# Patient Record
Sex: Male | Born: 1935 | Race: Black or African American | Hispanic: No | Marital: Single | Smoking: Never smoker
Health system: Southern US, Community
[De-identification: ages and names within clinical notes are randomized; demographics above are authoritative.]

## PROBLEM LIST (undated history)

## (undated) DIAGNOSIS — C801 Malignant (primary) neoplasm, unspecified: Secondary | ICD-10-CM

## (undated) DIAGNOSIS — I10 Essential (primary) hypertension: Secondary | ICD-10-CM

## (undated) DIAGNOSIS — I639 Cerebral infarction, unspecified: Secondary | ICD-10-CM

## (undated) HISTORY — PX: NO PAST SURGERIES: SHX2092

---

## 2003-12-28 ENCOUNTER — Ambulatory Visit: Payer: Self-pay | Admitting: Radiation Oncology

## 2004-01-28 ENCOUNTER — Ambulatory Visit: Payer: Self-pay | Admitting: Radiation Oncology

## 2004-02-27 ENCOUNTER — Ambulatory Visit: Payer: Self-pay | Admitting: Radiation Oncology

## 2004-03-29 ENCOUNTER — Ambulatory Visit: Payer: Self-pay | Admitting: Radiation Oncology

## 2009-04-14 ENCOUNTER — Emergency Department: Payer: Self-pay | Admitting: Emergency Medicine

## 2009-06-07 ENCOUNTER — Emergency Department: Payer: Self-pay | Admitting: Unknown Physician Specialty

## 2009-06-16 ENCOUNTER — Emergency Department: Payer: Self-pay | Admitting: Emergency Medicine

## 2014-11-27 ENCOUNTER — Encounter: Payer: Self-pay | Admitting: *Deleted

## 2014-11-27 ENCOUNTER — Emergency Department: Payer: Medicare Other

## 2014-11-27 ENCOUNTER — Inpatient Hospital Stay
Admission: EM | Admit: 2014-11-27 | Discharge: 2014-12-01 | DRG: 064 | Disposition: A | Discharge status: Skilled Nursing Facility | Payer: Medicare Other | Attending: Internal Medicine | Admitting: Internal Medicine

## 2014-11-27 DIAGNOSIS — I4891 Unspecified atrial fibrillation: Secondary | ICD-10-CM | POA: Diagnosis present

## 2014-11-27 DIAGNOSIS — I10 Essential (primary) hypertension: Secondary | ICD-10-CM | POA: Diagnosis present

## 2014-11-27 DIAGNOSIS — Y92009 Unspecified place in unspecified non-institutional (private) residence as the place of occurrence of the external cause: Secondary | ICD-10-CM

## 2014-11-27 DIAGNOSIS — N179 Acute kidney failure, unspecified: Secondary | ICD-10-CM | POA: Diagnosis present

## 2014-11-27 DIAGNOSIS — R7989 Other specified abnormal findings of blood chemistry: Secondary | ICD-10-CM

## 2014-11-27 DIAGNOSIS — R29810 Facial weakness: Secondary | ICD-10-CM | POA: Diagnosis present

## 2014-11-27 DIAGNOSIS — D696 Thrombocytopenia, unspecified: Secondary | ICD-10-CM | POA: Diagnosis present

## 2014-11-27 DIAGNOSIS — Z859 Personal history of malignant neoplasm, unspecified: Secondary | ICD-10-CM

## 2014-11-27 DIAGNOSIS — Z923 Personal history of irradiation: Secondary | ICD-10-CM | POA: Diagnosis not present

## 2014-11-27 DIAGNOSIS — I672 Cerebral atherosclerosis: Secondary | ICD-10-CM | POA: Diagnosis present

## 2014-11-27 DIAGNOSIS — T796XXA Traumatic ischemia of muscle, initial encounter: Secondary | ICD-10-CM

## 2014-11-27 DIAGNOSIS — R778 Other specified abnormalities of plasma proteins: Secondary | ICD-10-CM | POA: Diagnosis present

## 2014-11-27 DIAGNOSIS — I63511 Cerebral infarction due to unspecified occlusion or stenosis of right middle cerebral artery: Secondary | ICD-10-CM | POA: Diagnosis not present

## 2014-11-27 DIAGNOSIS — D72829 Elevated white blood cell count, unspecified: Secondary | ICD-10-CM | POA: Diagnosis present

## 2014-11-27 DIAGNOSIS — Z79899 Other long term (current) drug therapy: Secondary | ICD-10-CM | POA: Diagnosis not present

## 2014-11-27 DIAGNOSIS — Z515 Encounter for palliative care: Secondary | ICD-10-CM

## 2014-11-27 DIAGNOSIS — Z8673 Personal history of transient ischemic attack (TIA), and cerebral infarction without residual deficits: Secondary | ICD-10-CM | POA: Insufficient documentation

## 2014-11-27 DIAGNOSIS — I214 Non-ST elevation (NSTEMI) myocardial infarction: Secondary | ICD-10-CM

## 2014-11-27 DIAGNOSIS — W19XXXA Unspecified fall, initial encounter: Secondary | ICD-10-CM | POA: Diagnosis present

## 2014-11-27 DIAGNOSIS — Z7982 Long term (current) use of aspirin: Secondary | ICD-10-CM

## 2014-11-27 DIAGNOSIS — I639 Cerebral infarction, unspecified: Secondary | ICD-10-CM

## 2014-11-27 DIAGNOSIS — G936 Cerebral edema: Secondary | ICD-10-CM | POA: Diagnosis present

## 2014-11-27 DIAGNOSIS — I493 Ventricular premature depolarization: Secondary | ICD-10-CM | POA: Diagnosis present

## 2014-11-27 DIAGNOSIS — M6282 Rhabdomyolysis: Secondary | ICD-10-CM | POA: Diagnosis present

## 2014-11-27 DIAGNOSIS — G8194 Hemiplegia, unspecified affecting left nondominant side: Secondary | ICD-10-CM | POA: Diagnosis present

## 2014-11-27 DIAGNOSIS — R531 Weakness: Secondary | ICD-10-CM

## 2014-11-27 HISTORY — DX: Cerebral infarction, unspecified: I63.9

## 2014-11-27 HISTORY — DX: Malignant (primary) neoplasm, unspecified: C80.1

## 2014-11-27 HISTORY — DX: Essential (primary) hypertension: I10

## 2014-11-27 LAB — DIFFERENTIAL
Basophils Absolute: 0.2 10*3/uL — ABNORMAL HIGH (ref 0–0.1)
Basophils Relative: 1 %
EOS ABS: 0 10*3/uL (ref 0–0.7)
EOS PCT: 0 %
LYMPHS ABS: 1.1 10*3/uL (ref 1.0–3.6)
LYMPHS PCT: 7 %
MONOS PCT: 9 %
Monocytes Absolute: 1.4 10*3/uL — ABNORMAL HIGH (ref 0.2–1.0)
NEUTROS PCT: 83 %
Neutro Abs: 13.1 10*3/uL — ABNORMAL HIGH (ref 1.4–6.5)

## 2014-11-27 LAB — BASIC METABOLIC PANEL
ANION GAP: 10 (ref 5–15)
BUN: 31 mg/dL — ABNORMAL HIGH (ref 6–20)
CHLORIDE: 105 mmol/L (ref 101–111)
CO2: 24 mmol/L (ref 22–32)
Calcium: 8.8 mg/dL — ABNORMAL LOW (ref 8.9–10.3)
Creatinine, Ser: 1.73 mg/dL — ABNORMAL HIGH (ref 0.61–1.24)
GFR calc Af Amer: 41 mL/min — ABNORMAL LOW (ref >60)
GFR, EST NON AFRICAN AMERICAN: 35 mL/min — AB (ref >60)
GLUCOSE: 105 mg/dL — AB (ref 65–99)
POTASSIUM: 4.2 mmol/L (ref 3.5–5.1)
SODIUM: 139 mmol/L (ref 135–145)

## 2014-11-27 LAB — CBC
HEMATOCRIT: 46.5 % (ref 40.0–52.0)
HEMOGLOBIN: 15.5 g/dL (ref 13.0–18.0)
MCH: 30.7 pg (ref 26.0–34.0)
MCHC: 33.4 g/dL (ref 32.0–36.0)
MCV: 92.1 fL (ref 80.0–100.0)
Platelets: 134 10*3/uL — ABNORMAL LOW (ref 150–440)
RBC: 5.05 MIL/uL (ref 4.40–5.90)
RDW: 13.8 % (ref 11.5–14.5)
WBC: 15.8 10*3/uL — ABNORMAL HIGH (ref 3.8–10.6)

## 2014-11-27 LAB — CK
Total CK: 1816 U/L — ABNORMAL HIGH (ref 49–397)
Total CK: 2263 U/L — ABNORMAL HIGH (ref 49–397)
Total CK: 2759 U/L — ABNORMAL HIGH (ref 49–397)

## 2014-11-27 LAB — COMPREHENSIVE METABOLIC PANEL
ALBUMIN: 4 g/dL (ref 3.5–5.0)
ALK PHOS: 73 U/L (ref 38–126)
ALT: 23 U/L (ref 17–63)
ANION GAP: 14 (ref 5–15)
AST: 70 U/L — ABNORMAL HIGH (ref 15–41)
BILIRUBIN TOTAL: 1.2 mg/dL (ref 0.3–1.2)
BUN: 30 mg/dL — AB (ref 6–20)
CALCIUM: 9.5 mg/dL (ref 8.9–10.3)
CO2: 22 mmol/L (ref 22–32)
Chloride: 104 mmol/L (ref 101–111)
Creatinine, Ser: 1.79 mg/dL — ABNORMAL HIGH (ref 0.61–1.24)
GFR calc Af Amer: 39 mL/min — ABNORMAL LOW (ref >60)
GFR, EST NON AFRICAN AMERICAN: 34 mL/min — AB (ref >60)
GLUCOSE: 121 mg/dL — AB (ref 65–99)
POTASSIUM: 4.2 mmol/L (ref 3.5–5.1)
Sodium: 140 mmol/L (ref 135–145)
TOTAL PROTEIN: 7.5 g/dL (ref 6.5–8.1)

## 2014-11-27 LAB — TROPONIN I
TROPONIN I: 0.09 ng/mL — AB (ref <0.031)
TROPONIN I: 0.15 ng/mL — AB (ref <0.031)
Troponin I: 0.12 ng/mL — ABNORMAL HIGH (ref <0.031)

## 2014-11-27 LAB — PROTIME-INR
INR: 1.15
Prothrombin Time: 14.9 seconds (ref 11.4–15.0)

## 2014-11-27 LAB — APTT: aPTT: 30 seconds (ref 24–36)

## 2014-11-27 MED ORDER — ACETAMINOPHEN 650 MG RE SUPP: 650.0000 mg | Freq: Four times a day (QID) | RECTAL | Status: DC | PRN

## 2014-11-27 MED ORDER — ASPIRIN 81 MG PO CHEW
324.0000 mg | CHEWABLE_TABLET | Freq: Once | ORAL | Status: AC
  Administered 2014-11-27: 324 mg via ORAL
  Filled 2014-11-27: qty 4

## 2014-11-27 MED ORDER — NITROGLYCERIN 2 % TD OINT
0.5000 [in_us] | TOPICAL_OINTMENT | Freq: Once | TRANSDERMAL | Status: AC
  Administered 2014-11-27: 0.5 [in_us] via TOPICAL
  Filled 2014-11-27: qty 1

## 2014-11-27 MED ORDER — SODIUM CHLORIDE 0.9 % IV SOLN
Freq: Once | INTRAVENOUS | Status: AC
  Administered 2014-11-27: 17:00:00 via INTRAVENOUS

## 2014-11-27 MED ORDER — HEPARIN BOLUS VIA INFUSION
4000.0000 [IU] | Freq: Once | INTRAVENOUS | Status: AC
  Administered 2014-11-27: 4000 [IU] via INTRAVENOUS
  Filled 2014-11-27: qty 4000

## 2014-11-27 MED ORDER — SODIUM CHLORIDE 0.9 % IV BOLUS (SEPSIS)
500.0000 mL | INTRAVENOUS | Status: AC
  Administered 2014-11-27: 500 mL via INTRAVENOUS

## 2014-11-27 MED ORDER — HEPARIN SODIUM (PORCINE) 5000 UNIT/ML IJ SOLN: 60.0000 [IU]/kg | Freq: Once | INTRAMUSCULAR | Status: DC

## 2014-11-27 MED ORDER — SODIUM CHLORIDE 0.9 % IV SOLN: Freq: Once | INTRAVENOUS | Status: DC

## 2014-11-27 MED ORDER — STROKE: EARLY STAGES OF RECOVERY BOOK
Freq: Once | Status: AC
  Administered 2014-11-27

## 2014-11-27 MED ORDER — ACETAMINOPHEN 325 MG PO TABS: 650.0000 mg | ORAL_TABLET | Freq: Four times a day (QID) | ORAL | Status: DC | PRN

## 2014-11-27 MED ORDER — HEPARIN (PORCINE) IN NACL 100-0.45 UNIT/ML-% IJ SOLN: 10.0000 [IU]/kg/h | Freq: Once | INTRAMUSCULAR | Status: DC

## 2014-11-27 MED ORDER — SODIUM CHLORIDE 0.9 % IJ SOLN
3.0000 mL | Freq: Two times a day (BID) | INTRAMUSCULAR | Status: DC
  Administered 2014-11-27 – 2014-11-30 (×4): 3 mL via INTRAVENOUS

## 2014-11-27 MED ORDER — SODIUM CHLORIDE 0.9 % IV SOLN
INTRAVENOUS | Status: AC
  Administered 2014-11-27: 23:00:00 via INTRAVENOUS

## 2014-11-27 MED ORDER — ONDANSETRON HCL 4 MG/2ML IJ SOLN
4.0000 mg | Freq: Four times a day (QID) | INTRAMUSCULAR | Status: DC | PRN
Start: 2014-11-27 — End: 2014-12-01
  Filled 2014-11-27: qty 2

## 2014-11-27 MED ORDER — ONDANSETRON HCL 4 MG PO TABS: 4.0000 mg | ORAL_TABLET | Freq: Four times a day (QID) | ORAL | Status: DC | PRN

## 2014-11-27 MED ORDER — HEPARIN (PORCINE) IN NACL 100-0.45 UNIT/ML-% IJ SOLN
850.0000 [IU]/h | INTRAMUSCULAR | Status: DC
  Administered 2014-11-27: 850 [IU]/h via INTRAVENOUS
  Filled 2014-11-27: qty 250

## 2014-11-27 NOTE — ED Notes (Signed)
pts family arrived at bedside, states they are unsure of the exact time they saw pt at baseline, state he has been weak and off for a while

## 2014-11-27 NOTE — ED Notes (Signed)
Patient transported to CT 

## 2014-11-27 NOTE — ED Provider Notes (Addendum)
-----------------------------------------   3:57 PM on 11/27/2014 -----------------------------------------   Blood pressure 151/83, pulse 72, temperature 97.6 F (36.4 C), temperature source Oral, resp. rate 17, height $RemoveBe'5\' 8"'UjcZBDJwj$  (1.727 m), weight 155 lb 10.3 oz (70.6 kg), SpO2 99 %.  Assuming care from Dr. Cinda Quest.  In short, Zachary Leonard is a 81 y.o. male with a chief complaint of Fall .  Refer to the original H&P for additional details.  The current plan of care is to follow-up on the transfer request to the [place].  The patient has an apparent subacute CVA based on his left-sided facial droop and left arm weakness that is outside the window for TPA because we do not know the onset of symptoms.  He also has a rising troponin and a CK consistent with rhabdomyolysis given the amount of time he was down on the floor.  I have ordered a 500 mL normal saline IV bolus followed by normal saline at 250 mL an hour.  I have added on a CK to his second troponin to see if the CK continues to rise.  The patient is currently receiving heparin as per Dr. Sonny Dandy orders.  ----------------------------------------- 5:06 PM on 11/27/2014 -----------------------------------------  Dr. Cinda Quest and I met with the patient's family, and Dr. Cinda Quest reevaluated the patient and spoke with him.  Patient no longer wants to go to the [place] and he is signing the paper waiting that right.  Additionally, it is notable that his CK and his troponin continued to rise and it is likely more appropriate that he stay here rather than be transported under the circumstances.  He is currently getting a heparin drip, IV fluids for the rhabdomyolysis, and will need intensive care at least for the short-term.  I spoken with the hospitalist who will admit.  ----------------------------------------- 7:38 PM on 11/27/2014 -----------------------------------------  After my last note, the patient told Dr. Cinda Quest that actually he did not refuse  admission to the [place], and that he would be willing to go if they have a bed.  However, in the intervening hours, we have continued to call the [facility] at least once an hour, and they have not yet determined the disposition.  I just reassessed the patient and he is extremely somnolent with profound neurological deficits.  The last time he checked he had a rising troponin and a rising CK.  I believe that he is medically unstable to continue waiting in the emergency department for disposition to the [place] or for transport.  I will admit here to this hospital for the patient's safety.  ----------------------------------------- 8:22 PM on 11/27/2014 -----------------------------------------  I spoke by phone with Dr. Kaylyn Layer at the [facility].  he agrees that the patient is too ill for transport at this time, and he also states that they do not have the capacity to accept him as a transfer.  We are proceeding with the plan for admission to [facility].  Hinda Kehr, MD 11/27/14 2023

## 2014-11-27 NOTE — Progress Notes (Signed)
ANTICOAGULATION CONSULT NOTE - Initial Consult  Pharmacy Consult for Heparin Drip Indication: chest pain/ACS  No Known Allergies  Patient Measurements: Height: 5\' 8"  (172.7 cm) Weight: 155 lb 10.3 oz (70.6 kg) IBW/kg (Calculated) : 68.4 Heparin Dosing Weight: 70.6  Vital Signs: Temp: 97.6 F (36.4 C) (08/31 1209) Temp Source: Oral (08/31 1209) BP: 151/83 mmHg (08/31 1500) Pulse Rate: 72 (08/31 1500)  Labs:  Recent Labs  11/27/14 1207 11/27/14 1426  HGB 15.5  --   HCT 46.5  --   PLT 134*  --   APTT 30  --   LABPROT 14.9  --   INR 1.15  --   CREATININE 1.79*  --   CKTOTAL 1816*  --   TROPONINI 0.09* 0.12*    Estimated Creatinine Clearance: 31.3 mL/min (by C-G formula based on Cr of 1.79).   Medical History: Past Medical History  Diagnosis Date  . Cancer     Medications:  Scheduled:  . heparin  4,000 Units Intravenous Once   Infusions:  . heparin      Assessment: 81 yo male to start Heparin Drip for ACS/STEMI.  Goal of Therapy:  Heparin level 0.3-0.7 units/ml Monitor platelets by anticoagulation protocol: Yes   Plan:  Give 4000 units bolus x 1,  then begin Heparin drip at 850 units/hr.  Will check Heparin level in 8 hours on 11/28/14 at [place]. CBC in am  Chinita Greenland PharmD Clinical Pharmacist 11/27/2014  3:29 PM

## 2014-11-27 NOTE — ED Notes (Signed)
Dr. Salley Scarlet notified of critical trop of 0.12

## 2014-11-27 NOTE — ED Notes (Signed)
Dr. Salley Scarlet notified of critical trop of 0.09

## 2014-11-27 NOTE — H&P (Signed)
[facility] [place] NAME: Zachary Leonard    MR#:  [id number]  DATE OF BIRTH:  Sep 16, 1931  DATE OF ADMISSION:  11/27/2014  PRIMARY CARE PHYSICIAN: No primary care provider on file.   REQUESTING/REFERRING PHYSICIAN: Karma Greaser, MD  CHIEF COMPLAINT:   Chief Complaint  Patient presents with  . Fall    HISTORY OF PRESENT ILLNESS:  Zachary Leonard  is a 81 y.o. male who presents with left-sided weakness and left facial droop. Patient was found down at home for an unknown period of time by a maintenance worker for early this morning. He states that he fell last night when his "legs gave way". He denies any symptoms of chest pain, shortness of breath, headache, visual changes, diaphoresis, abdominal pain. However, he states that once he fallen he was unable to use the left side of his body, and was therefore unable to get himself up. He states that initially his right arm and leg felt weak as well. On evaluation in the ED patient has a mildly positive troponin, an elevated CPK, persistent left hemiparesis. CT head is suggestive of cerebral infarct. Hospitalists were called for admission for acute stroke.  PAST MEDICAL HISTORY:   Past Medical History  Diagnosis Date  . Cancer     prostate, treated with XRT  . HTN (hypertension)     PAST SURGICAL HISTORY:   Past Surgical History  Procedure Laterality Date  . No past surgeries      SOCIAL HISTORY:   Social History  Substance Use Topics  . Smoking status: Never Smoker   . Smokeless tobacco: Not on file  . Alcohol Use: No     Comment: quit 10-12 years ago, prior drank 1 glass of wine daily    FAMILY HISTORY:   Family History  Problem Relation Age of Onset  . Hypertension      DRUG ALLERGIES:  No Known Allergies  MEDICATIONS AT HOME:   Prior to Admission medications   Medication Sig Start Date End Date Taking? Authorizing Provider  aspirin EC 81 MG tablet Take 81 mg by mouth  daily.   Yes Historical Provider, MD  lisinopril-hydrochlorothiazide (PRINZIDE,ZESTORETIC) 10-12.5 MG per tablet Take 1 tablet by mouth daily.   Yes Historical Provider, MD  Multiple Vitamin (MULTIVITAMIN WITH MINERALS) TABS tablet Take 1 tablet by mouth daily.   Yes Historical Provider, MD    REVIEW OF SYSTEMS:  Review of Systems  Constitutional: Negative for fever, chills, weight loss and malaise/fatigue.  HENT: Negative for ear pain, hearing loss and tinnitus.   Eyes: Negative for blurred vision, double vision, pain and redness.  Respiratory: Negative for cough, hemoptysis and shortness of breath.   Cardiovascular: Negative for chest pain, palpitations, orthopnea and leg swelling.  Gastrointestinal: Negative for nausea, vomiting, abdominal pain, diarrhea and constipation.  Genitourinary: Negative for dysuria, frequency and hematuria.  Musculoskeletal: Negative for back pain, joint pain and neck pain.  Skin:       No acne, rash, or lesions  Neurological: Positive for focal weakness (left hemiparesis, left facial droop) and weakness. Negative for dizziness, tremors, sensory change and loss of consciousness.  Endo/Heme/Allergies: Negative for polydipsia. Does not bruise/bleed easily.  Psychiatric/Behavioral: Negative for depression. The patient is not nervous/anxious and does not have insomnia.      VITAL SIGNS:   Filed Vitals:   11/27/14 1830 11/27/14 1900 11/27/14 1930 11/27/14 2000  BP: 157/80 134/78 149/66 165/92  Pulse: 71 65 66 73  Temp:      TempSrc:      Resp: 24 20 20 18   Height:      Weight:      SpO2: 99% 95% 98% 98%   Wt Readings from Last 3 Encounters:  11/27/14 70.6 kg (155 lb 10.3 oz)    PHYSICAL EXAMINATION:  Physical Exam  Vitals reviewed. Constitutional: He is oriented to person, place, and time. He appears well-developed and well-nourished. No distress.  HENT:  Head: Normocephalic and atraumatic.  Mouth/Throat: Oropharynx is clear and moist.  Eyes:  Conjunctivae and EOM are normal. Pupils are equal, round, and reactive to light. No scleral icterus.  Neck: Normal range of motion. Neck supple. No JVD present. No thyromegaly present.  Cardiovascular: Normal rate, regular rhythm and intact distal pulses.  Exam reveals no gallop and no friction rub.   No murmur heard. Respiratory: Effort normal and breath sounds normal. No respiratory distress. He has no wheezes. He has no rales.  GI: Soft. Bowel sounds are normal. He exhibits no distension. There is no tenderness.  Musculoskeletal: Normal range of motion. He exhibits no edema.  No arthritis, no gout  Lymphadenopathy:    He has no cervical adenopathy.  Neurological: He is alert and oriented to person, place, and time.  Neurologic: Cranial nerves II-XII - significant left lower facial droop with mild weakness noted left upper face and leftward tongue deviation, Sensation intact to light touch/pinprick, 5/5 strength in right extremities,  left upper extremity has 3/5 strength, left lower extremity has 2/5 strength,  positive for dysarthria, no aphasia, memory intact, difficult to test, but suspect left visual field cut versus left neglect.   Skin: Skin is warm and dry. No rash noted. No erythema.  Psychiatric: He has a normal mood and affect. His behavior is normal. Judgment and thought content normal.    LABORATORY PANEL:   CBC  Recent Labs Lab 11/27/14 1207  WBC 15.8*  HGB 15.5  HCT 46.5  PLT 134*   ------------------------------------------------------------------------------------------------------------------  Chemistries   Recent Labs Lab 11/27/14 1207 11/27/14 1950  NA 140 139  K 4.2 4.2  CL 104 105  CO2 22 24  GLUCOSE 121* 105*  BUN 30* 31*  CREATININE 1.79* 1.73*  CALCIUM 9.5 8.8*  AST 70*  --   ALT 23  --   ALKPHOS 73  --   BILITOT 1.2  --     ------------------------------------------------------------------------------------------------------------------  Cardiac Enzymes  Recent Labs Lab 11/27/14 1950  TROPONINI 0.15*   ------------------------------------------------------------------------------------------------------------------  RADIOLOGY:  Ct Head Wo Contrast  11/27/2014   CLINICAL DATA:  Found unconscious at home.  Left-sided weakness.  EXAM: CT HEAD WITHOUT CONTRAST  TECHNIQUE: Contiguous axial images were obtained from the base of the skull through the vertex without intravenous contrast.  COMPARISON:  None.  FINDINGS: Stable age related cerebral atrophy, ventriculomegaly and periventricular white matter disease. No extra-axial fluid collections are identified. No CT findings for acute hemispheric infarction or intracranial hemorrhage. No mass lesions. There is and age indeterminate right thalamic lacunar type infarct. The brainstem and cerebellum are normal.  No acute bony findings. The paranasal sinuses and mastoid air cells are clear.  IMPRESSION: 1. Age related cerebral atrophy, ventriculomegaly and periventricular white matter disease. 2. No findings for hemispheric infarction or intracranial hemorrhage. 3. Age-indeterminate right thymic lacunar type infarct. MRI with diffusion weighted imaging may be helpful for further evaluation.   Electronically Signed   By: Marijo Sanes M.D.   On: 11/27/2014 12:50  EKG:   Orders placed or performed during the hospital encounter of 11/27/14  . ED EKG  . ED EKG    IMPRESSION AND PLAN:  Principal Problem:   CVA (cerebral infarction) - based on patient's clinical symptoms and suggested on CT head. Patient is currently on heparin drip due to his elevated troponin and some concern for potential cardiac problem. Allow permissive hypertension to blood pressure less than 220/110 for the first 24 hours, check fasting lipid panel, hemoglobin A1c, continue to trend troponins, get  an echocardiogram, MRI/MRA head and neck, consult PT, OT, speech therapy, other standard orders per stroke order set, and get a neurology consult Active Problems:   Benign essential HTN - hold antihypertensives with goal blood pressure as above for the first 24 hours   Elevated troponin - potentially due to his stroke, potentially due to his rhabdo, we'll continue to trend, currently on heparin drip, get cardiology consult   Rhabdomyolysis - due to being down for an unknown amount of time. Trend CPK, moderately aggressive fluids, and avoid starting any statins for now.   AKI (acute kidney injury) -  unknown baseline, the presumed to be normal as patient has no history of kidney disease, possibly due to his rhabdo, fluids as above, avoid nephrotoxins, monitor creatinine.   All the records are reviewed and case discussed with ED provider. Management plans discussed with the patient and/or family.  DVT PROPHYLAXIS: systemic anticoagulation  ADMISSION STATUS: Inpatient  CODE STATUS: Full  TOTAL TIME TAKING CARE OF THIS PATIENT: 50 minutes.    Trip Cavanagh Taft 11/27/2014, 8:46 PM  [facility]  Office  [phone]  CC: Primary care physician; No primary care provider on file.
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
## 2014-11-27 NOTE — ED Notes (Signed)
Pt resting in bed, family at bedside, pt appears to be in no distress

## 2014-11-27 NOTE — Plan of Care (Signed)
Problem: Discharge/Transitional Outcomes Goal: Barriers To Progression Addressed/Resolved Individualization: Pt prefers to be called Zachary Leonard who lives at home alone.  Hx HTN & prostate cancer controlled by home medications.  High fall risk due to bilateral weakness, left side more prominent. Bed alarm on, hourly rounding. Understand how to use call system for assistance.

## 2014-11-27 NOTE — Care Management Note (Signed)
Case Management Note  Patient Details  Name: Zachary Leonard MRN: [id number] Date of Birth: 12-22-1931  Subjective/Objective:     Have been advised by [place], [place]. Access, that the pt. Has VA benefits. I have told Dr. Cinda Quest, and he is aware of the process if the pt. REQUIRES ADMISSION.               Action/Plan:   Expected Discharge Date:                  Expected Discharge Plan:     In-House Referral:     Discharge planning Services     Post Acute Care Choice:    Choice offered to:     DME Arranged:    DME Agency:     HH Arranged:    [place] Agency:     Status of Service:     Medicare Important Message Given:    Date Medicare IM Given:    Medicare IM give by:    Date Additional Medicare IM Given:    Additional Medicare Important Message give by:     If discussed at [place] of Stay Meetings, dates discussed:    Additional Comments:  Beau Fanny, RN 11/27/2014, 12:56 PM
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
## 2014-11-28 ENCOUNTER — Inpatient Hospital Stay
Admit: 2014-11-28 | Discharge: 2014-11-28 | Disposition: A | Discharge status: Home / Self Care | Payer: Medicare Other | Attending: Internal Medicine | Admitting: Internal Medicine

## 2014-11-28 ENCOUNTER — Inpatient Hospital Stay: Payer: Medicare Other

## 2014-11-28 DIAGNOSIS — Z515 Encounter for palliative care: Secondary | ICD-10-CM

## 2014-11-28 DIAGNOSIS — Z8546 Personal history of malignant neoplasm of prostate: Secondary | ICD-10-CM

## 2014-11-28 DIAGNOSIS — R7989 Other specified abnormal findings of blood chemistry: Secondary | ICD-10-CM

## 2014-11-28 DIAGNOSIS — Z923 Personal history of irradiation: Secondary | ICD-10-CM

## 2014-11-28 DIAGNOSIS — G936 Cerebral edema: Secondary | ICD-10-CM

## 2014-11-28 DIAGNOSIS — Z7289 Other problems related to lifestyle: Secondary | ICD-10-CM

## 2014-11-28 DIAGNOSIS — M6282 Rhabdomyolysis: Secondary | ICD-10-CM

## 2014-11-28 DIAGNOSIS — I69392 Facial weakness following cerebral infarction: Secondary | ICD-10-CM

## 2014-11-28 DIAGNOSIS — Z8673 Personal history of transient ischemic attack (TIA), and cerebral infarction without residual deficits: Secondary | ICD-10-CM | POA: Insufficient documentation

## 2014-11-28 DIAGNOSIS — D696 Thrombocytopenia, unspecified: Secondary | ICD-10-CM

## 2014-11-28 DIAGNOSIS — I639 Cerebral infarction, unspecified: Secondary | ICD-10-CM

## 2014-11-28 DIAGNOSIS — D72829 Elevated white blood cell count, unspecified: Secondary | ICD-10-CM

## 2014-11-28 DIAGNOSIS — Z7401 Bed confinement status: Secondary | ICD-10-CM

## 2014-11-28 LAB — BASIC METABOLIC PANEL
ANION GAP: 9 (ref 5–15)
BUN: 31 mg/dL — ABNORMAL HIGH (ref 6–20)
CO2: 23 mmol/L (ref 22–32)
Calcium: 8.5 mg/dL — ABNORMAL LOW (ref 8.9–10.3)
Chloride: 108 mmol/L (ref 101–111)
Creatinine, Ser: 1.64 mg/dL — ABNORMAL HIGH (ref 0.61–1.24)
GFR calc Af Amer: 44 mL/min — ABNORMAL LOW (ref >60)
GFR, EST NON AFRICAN AMERICAN: 38 mL/min — AB (ref >60)
Glucose, Bld: 89 mg/dL (ref 65–99)
POTASSIUM: 4 mmol/L (ref 3.5–5.1)
SODIUM: 140 mmol/L (ref 135–145)

## 2014-11-28 LAB — TROPONIN I
Troponin I: 0.12 ng/mL — ABNORMAL HIGH (ref <0.031)
Troponin I: 0.13 ng/mL — ABNORMAL HIGH (ref <0.031)
Troponin I: 0.09 ng/mL — ABNORMAL HIGH (ref <0.031)

## 2014-11-28 LAB — CK
CK TOTAL: 3134 U/L — AB (ref 49–397)
CK TOTAL: 2829 U/L — AB (ref 49–397)

## 2014-11-28 LAB — LIPID PANEL
CHOL/HDL RATIO: 3.5 ratio
CHOLESTEROL: 166 mg/dL (ref 0–200)
HDL: 47 mg/dL (ref >40)
LDL Cholesterol: 109 mg/dL — ABNORMAL HIGH (ref 0–99)
Triglycerides: 50 mg/dL (ref <150)
VLDL: 10 mg/dL (ref 0–40)

## 2014-11-28 LAB — CBC
HEMATOCRIT: 40.1 % (ref 40.0–52.0)
HEMOGLOBIN: 13.5 g/dL (ref 13.0–18.0)
MCH: 31.2 pg (ref 26.0–34.0)
MCHC: 33.6 g/dL (ref 32.0–36.0)
MCV: 92.9 fL (ref 80.0–100.0)
PLATELETS: 110 10*3/uL — AB (ref 150–440)
RBC: 4.32 MIL/uL — AB (ref 4.40–5.90)
RDW: 14 % (ref 11.5–14.5)
WBC: 12.4 10*3/uL — AB (ref 3.8–10.6)

## 2014-11-28 LAB — HEMOGLOBIN A1C: Hgb A1c MFr Bld: 5.8 % (ref 4.0–6.0)

## 2014-11-28 LAB — HEPARIN LEVEL (UNFRACTIONATED): HEPARIN UNFRACTIONATED: 1.25 [IU]/mL — AB (ref 0.30–0.70)

## 2014-11-28 MED ORDER — ENOXAPARIN SODIUM 40 MG/0.4ML ~~LOC~~ SOLN
40.0000 mg | SUBCUTANEOUS | Status: DC
  Administered 2014-11-28 – 2014-11-30 (×3): 40 mg via SUBCUTANEOUS
  Filled 2014-11-28 (×3): qty 0.4

## 2014-11-28 MED ORDER — GADOBENATE DIMEGLUMINE 529 MG/ML IV SOLN
15.0000 mL | Freq: Once | INTRAVENOUS | Status: AC | PRN
  Administered 2014-11-28: 11:00:00 14 mL via INTRAVENOUS

## 2014-11-28 MED ORDER — CETYLPYRIDINIUM CHLORIDE 0.05 % MT LIQD
7.0000 mL | Freq: Two times a day (BID) | OROMUCOSAL | Status: DC
  Administered 2014-11-28 – 2014-11-29 (×3): 7 mL via OROMUCOSAL

## 2014-11-28 MED ORDER — SODIUM CHLORIDE 0.9 % IV SOLN
INTRAVENOUS | Status: DC
  Administered 2014-11-28 – 2014-12-01 (×10): via INTRAVENOUS

## 2014-11-28 MED ORDER — CHLORHEXIDINE GLUCONATE 0.12 % MT SOLN
15.0000 mL | Freq: Two times a day (BID) | OROMUCOSAL | Status: DC
  Administered 2014-11-30 – 2014-12-01 (×3): 15 mL via OROMUCOSAL
  Filled 2014-11-28 (×3): qty 15

## 2014-11-28 MED ORDER — ASPIRIN 300 MG RE SUPP
300.0000 mg | Freq: Every day | RECTAL | Status: DC
Start: 2014-11-28 — End: 2014-12-01
  Administered 2014-11-28 – 2014-12-01 (×4): 300 mg via RECTAL
  Filled 2014-11-28 (×4): qty 1

## 2014-11-28 MED ORDER — HEPARIN (PORCINE) IN NACL 100-0.45 UNIT/ML-% IJ SOLN
650.0000 [IU]/h | INTRAMUSCULAR | Status: DC
  Administered 2014-11-28: 650 [IU]/h via INTRAVENOUS

## 2014-11-28 MED ORDER — ATORVASTATIN CALCIUM 20 MG PO TABS
20.0000 mg | ORAL_TABLET | Freq: Every day | ORAL | Status: DC
  Administered 2014-11-28 – 2014-11-30 (×3): 20 mg via ORAL
  Filled 2014-11-28 (×3): qty 1

## 2014-11-28 NOTE — Clinical Documentation Improvement (Signed)
Internal Medicine  Based on the clinical findings below, please document any associated diagnoses/conditions the patient has or may have.   Cerebral edema  Other  Clinically Undetermined  Supporting Information: MRI/MRA note cytotosic edema.  Please document in your future progress notes if you agree with the radiologist's finding.     Please exercise your independent, professional judgment when responding. A specific answer is not anticipated or expected.  Thank you, Mateo Flow, RN [phone] Clinical Documentation Specialist

## 2014-11-28 NOTE — Progress Notes (Signed)
ANTICOAGULATION CONSULT NOTE - Follow up  Pharmacy Consult for Heparin Drip Indication: chest pain/ACS  No Known Allergies  Patient Measurements: Height: 5\' 8"  (172.7 cm) Weight: 151 lb 14.4 oz (68.901 kg) IBW/kg (Calculated) : 68.4 Heparin Dosing Weight: 70.6  Vital Signs: Temp: 97.8 F (36.6 C) (09/01 0126) Temp Source: Oral (09/01 0126) BP: 146/64 mmHg (09/01 0126) Pulse Rate: 68 (09/01 0126)  Labs:  Recent Labs  11/27/14 1207 11/27/14 1426 11/27/14 1950 11/27/14 2231 11/28/14 0152  HGB 15.5  --   --   --   --   HCT 46.5  --   --   --   --   PLT 134*  --   --   --   --   APTT 30  --   --   --   --   LABPROT 14.9  --   --   --   --   INR 1.15  --   --   --   --   HEPARINUNFRC  --   --   --   --  1.25*  CREATININE 1.79*  --  1.73*  --   --   CKTOTAL 1816* 2263* 2759*  --   --   TROPONINI 0.09* 0.12* 0.15* 0.12*  --     Estimated Creatinine Clearance: 32.4 mL/min (by C-G formula based on Cr of 1.73).   Medical History: Past Medical History  Diagnosis Date  . Cancer     prostate, treated with XRT  . HTN (hypertension)     Medications:  Scheduled:  . sodium chloride  3 mL Intravenous Q12H   Infusions:  . sodium chloride 100 mL/hr at 11/27/14 2300  . heparin      Assessment: 81 yo male to start Heparin Drip for ACS/STEMI.  Goal of Therapy:  Heparin level 0.3-0.7 units/ml Monitor platelets by anticoagulation protocol: Yes   Plan:  Heparin level supratherapeutic at 1.25. Spoke with RN, hold dose x 1 hr then reduce dose to 650 units/hr. Will follow up level in 8 hours.  Crit Obremski A. Jordan Hawks, PharmD Clinical Pharmacist 11/28/2014  2:49 AM

## 2014-11-28 NOTE — Consult Note (Signed)
Reason for Consult: stroke Referring Physician: Dr. Etheleen Nicks is an 81 y.o. male.  HPI: seen at request of Dr. Manuella Ghazi for stroke;  81 yo RHD M presents to [facility] after being found down.  It is not known how long he was down.  Once found he was noted to have severe R hemiparesis and increased cardiac enzymes and was admitted for this.  Pt denies headache and is completely aware of his deficits when asked.  He states that he thinks that he has had a stroke because people have told him that.    Past Medical History  Diagnosis Date  . Cancer     prostate, treated with XRT  . HTN (hypertension)     Past Surgical History  Procedure Laterality Date  . No past surgeries      Family History  Problem Relation Age of Onset  . Hypertension      Social History:  reports that he has never smoked. He does not have any smokeless tobacco history on file. He reports that he does not drink alcohol or use illicit drugs.  Allergies: No Known Allergies  Medications: personally reviewed by me  Results for orders placed or performed during the hospital encounter of 11/27/14 (from the past 48 hour(s))  Protime-INR     Status: None   Collection Time: 11/27/14 12:07 PM  Result Value Ref Range   Prothrombin Time 14.9 11.4 - 15.0 seconds   INR 1.15   APTT     Status: None   Collection Time: 11/27/14 12:07 PM  Result Value Ref Range   aPTT 30 24 - 36 seconds  CBC     Status: Abnormal   Collection Time: 11/27/14 12:07 PM  Result Value Ref Range   WBC 15.8 (H) 3.8 - 10.6 K/uL   RBC 5.05 4.40 - 5.90 MIL/uL   Hemoglobin 15.5 13.0 - 18.0 g/dL   HCT 46.5 40.0 - 52.0 %   MCV 92.1 80.0 - 100.0 fL   MCH 30.7 26.0 - 34.0 pg   MCHC 33.4 32.0 - 36.0 g/dL   RDW 13.8 11.5 - 14.5 %   Platelets 134 (L) 150 - 440 K/uL  Differential     Status: Abnormal   Collection Time: 11/27/14 12:07 PM  Result Value Ref Range   Neutrophils Relative % 83 %   Neutro Abs 13.1 (H) 1.4 - 6.5 K/uL   Lymphocytes Relative 7 %    Lymphs Abs 1.1 1.0 - 3.6 K/uL   Monocytes Relative 9 %   Monocytes Absolute 1.4 (H) 0.2 - 1.0 K/uL   Eosinophils Relative 0 %   Eosinophils Absolute 0.0 0 - 0.7 K/uL   Basophils Relative 1 %   Basophils Absolute 0.2 (H) 0 - 0.1 K/uL  Comprehensive metabolic panel     Status: Abnormal   Collection Time: 11/27/14 12:07 PM  Result Value Ref Range   Sodium 140 135 - 145 mmol/L   Potassium 4.2 3.5 - 5.1 mmol/L   Chloride 104 101 - 111 mmol/L   CO2 22 22 - 32 mmol/L   Glucose, Bld 121 (H) 65 - 99 mg/dL   BUN 30 (H) 6 - 20 mg/dL   Creatinine, Ser 1.79 (H) 0.61 - 1.24 mg/dL   Calcium 9.5 8.9 - 10.3 mg/dL   Total Protein 7.5 6.5 - 8.1 g/dL   Albumin 4.0 3.5 - 5.0 g/dL   AST 70 (H) 15 - 41 U/L   ALT 23 17 - 63  U/L   Alkaline Phosphatase 73 38 - 126 U/L   Total Bilirubin 1.2 0.3 - 1.2 mg/dL   GFR calc non Af Amer 34 (L) >60 mL/min   GFR calc Af Amer 39 (L) >60 mL/min    Comment: (NOTE) The eGFR has been calculated using the CKD EPI equation. This calculation has not been validated in all clinical situations. eGFR's persistently <60 mL/min signify possible Chronic Kidney Disease.    Anion gap 14 5 - 15  Troponin I     Status: Abnormal   Collection Time: 11/27/14 12:07 PM  Result Value Ref Range   Troponin I 0.09 (H) <0.031 ng/mL    Comment: READ BACK AND VERIFIED TO OLIVIA BROOMER AT 1254 ON 11/27/14.Marland KitchenMarland KitchenHelotes        PERSISTENTLY INCREASED TROPONIN VALUES IN THE RANGE OF 0.04-0.49 ng/mL CAN BE SEEN IN:       -UNSTABLE ANGINA       -CONGESTIVE HEART FAILURE       -MYOCARDITIS       -CHEST TRAUMA       -ARRYHTHMIAS       -LATE PRESENTING MYOCARDIAL INFARCTION       -COPD   CLINICAL FOLLOW-UP RECOMMENDED.   CK     Status: Abnormal   Collection Time: 11/27/14 12:07 PM  Result Value Ref Range   Total CK 1816 (H) 49 - 397 U/L  Troponin I     Status: Abnormal   Collection Time: 11/27/14  2:26 PM  Result Value Ref Range   Troponin I 0.12 (H) <0.031 ng/mL    Comment: READ BACK  AND VERIFIED WITH OLIVIA BROOMER AT 0240 11/27/14 DAS        PERSISTENTLY INCREASED TROPONIN VALUES IN THE RANGE OF 0.04-0.49 ng/mL CAN BE SEEN IN:       -UNSTABLE ANGINA       -CONGESTIVE HEART FAILURE       -MYOCARDITIS       -CHEST TRAUMA       -ARRYHTHMIAS       -LATE PRESENTING MYOCARDIAL INFARCTION       -COPD   CLINICAL FOLLOW-UP RECOMMENDED.   CK     Status: Abnormal   Collection Time: 11/27/14  2:26 PM  Result Value Ref Range   Total CK 2263 (H) 49 - 397 U/L  CK     Status: Abnormal   Collection Time: 11/27/14  7:50 PM  Result Value Ref Range   Total CK 2759 (H) 49 - 397 U/L  Troponin I     Status: Abnormal   Collection Time: 11/27/14  7:50 PM  Result Value Ref Range   Troponin I 0.15 (H) <0.031 ng/mL    Comment: RESULTS PREVIOUSLY CALLED BY DAS AT 1507 11/27/14.Marland KitchenMarland KitchenSMG        PERSISTENTLY INCREASED TROPONIN VALUES IN THE RANGE OF 0.04-0.49 ng/mL CAN BE SEEN IN:       -UNSTABLE ANGINA       -CONGESTIVE HEART FAILURE       -MYOCARDITIS       -CHEST TRAUMA       -ARRYHTHMIAS       -LATE PRESENTING MYOCARDIAL INFARCTION       -COPD   CLINICAL FOLLOW-UP RECOMMENDED.   Basic metabolic panel     Status: Abnormal   Collection Time: 11/27/14  7:50 PM  Result Value Ref Range   Sodium 139 135 - 145 mmol/L   Potassium 4.2 3.5 - 5.1 mmol/L   Chloride 105 101 -  111 mmol/L   CO2 24 22 - 32 mmol/L   Glucose, Bld 105 (H) 65 - 99 mg/dL   BUN 31 (H) 6 - 20 mg/dL   Creatinine, Ser 1.73 (H) 0.61 - 1.24 mg/dL   Calcium 8.8 (L) 8.9 - 10.3 mg/dL   GFR calc non Af Amer 35 (L) >60 mL/min   GFR calc Af Amer 41 (L) >60 mL/min    Comment: (NOTE) The eGFR has been calculated using the CKD EPI equation. This calculation has not been validated in all clinical situations. eGFR's persistently <60 mL/min signify possible Chronic Kidney Disease.    Anion gap 10 5 - 15  Troponin I     Status: Abnormal   Collection Time: 11/27/14 10:31 PM  Result Value Ref Range   Troponin I 0.12 (H)  <0.031 ng/mL    Comment: PREVIOUS RESULT CALLED 11/27/2014 1507 BY DAS,SMG/L[facility]  Heparin level (unfractionated)     Status: Abnormal   Collection Time: 11/28/14  1:52 AM  Result Value Ref Range   Heparin Unfractionated 1.25 (H) 0.30 - 0.70 IU/mL    Comment:        IF HEPARIN RESULTS ARE BELOW EXPECTED VALUES, AND PATIENT DOSAGE HAS BEEN CONFIRMED, SUGGEST FOLLOW UP TESTING OF ANTITHROMBIN III LEVELS.   CBC     Status: Abnormal   Collection Time: 11/28/14  4:16 AM  Result Value Ref Range   WBC 12.4 (H) 3.8 - 10.6 K/uL   RBC 4.32 (L) 4.40 - 5.90 MIL/uL   Hemoglobin 13.5 13.0 - 18.0 g/dL   HCT 40.1 40.0 - 52.0 %   MCV 92.9 80.0 - 100.0 fL   MCH 31.2 26.0 - 34.0 pg   MCHC 33.6 32.0 - 36.0 g/dL   RDW 14.0 11.5 - 14.5 %   Platelets 110 (L) 150 - 440 K/uL  Basic metabolic panel     Status: Abnormal   Collection Time: 11/28/14  4:16 AM  Result Value Ref Range   Sodium 140 135 - 145 mmol/L   Potassium 4.0 3.5 - 5.1 mmol/L   Chloride 108 101 - 111 mmol/L   CO2 23 22 - 32 mmol/L   Glucose, Bld 89 65 - 99 mg/dL   BUN 31 (H) 6 - 20 mg/dL   Creatinine, Ser 1.64 (H) 0.61 - 1.24 mg/dL   Calcium 8.5 (L) 8.9 - 10.3 mg/dL   GFR calc non Af Amer 38 (L) >60 mL/min   GFR calc Af Amer 44 (L) >60 mL/min    Comment: (NOTE) The eGFR has been calculated using the CKD EPI equation. This calculation has not been validated in all clinical situations. eGFR's persistently <60 mL/min signify possible Chronic Kidney Disease.    Anion gap 9 5 - 15  Lipid panel     Status: Abnormal   Collection Time: 11/28/14  4:16 AM  Result Value Ref Range   Cholesterol 166 0 - 200 mg/dL   Triglycerides 50 <150 mg/dL   HDL 47 >40 mg/dL   Total CHOL/HDL Ratio 3.5 RATIO   VLDL 10 0 - 40 mg/dL   LDL Cholesterol 109 (H) 0 - 99 mg/dL    Comment:        Total Cholesterol/HDL:CHD Risk Coronary Heart Disease Risk Table                     Men   Women  1/2 Average Risk   3.4   3.3  Average Risk  5.0   4.4  2 X  Average Risk   9.6   7.1  3 X Average Risk  23.4   11.0        Use the calculated Patient Ratio above and the CHD Risk Table to determine the patient's CHD Risk.        ATP III CLASSIFICATION (LDL):  <100     mg/dL   Optimal  100-129  mg/dL   Near or Above                    Optimal  130-159  mg/dL   Borderline  160-189  mg/dL   High  >190     mg/dL   Very High   Hemoglobin A1c     Status: None   Collection Time: 11/28/14  4:16 AM  Result Value Ref Range   Hgb A1c MFr Bld 5.8 4.0 - 6.0 %  Troponin I     Status: Abnormal   Collection Time: 11/28/14  4:16 AM  Result Value Ref Range   Troponin I 0.13 (H) <0.031 ng/mL    Comment: READ BACK AND VERIFIED JACKIE PAGE 11/28/2014 0458 [facility]        PERSISTENTLY INCREASED TROPONIN VALUES IN THE RANGE OF 0.04-0.49 ng/mL CAN BE SEEN IN:       -UNSTABLE ANGINA       -CONGESTIVE HEART FAILURE       -MYOCARDITIS       -CHEST TRAUMA       -ARRYHTHMIAS       -LATE PRESENTING MYOCARDIAL INFARCTION       -COPD   CLINICAL FOLLOW-UP RECOMMENDED.   CK     Status: Abnormal   Collection Time: 11/28/14  4:16 AM  Result Value Ref Range   Total CK 3134 (H) 49 - 397 U/L  Troponin I     Status: Abnormal   Collection Time: 11/28/14 11:52 AM  Result Value Ref Range   Troponin I 0.09 (H) <0.031 ng/mL    Comment: RESULTS PREVIOUSLY CALLED AT  0458 11/28/14 BY L[facility]...SDR        PERSISTENTLY INCREASED TROPONIN VALUES IN THE RANGE OF 0.04-0.49 ng/mL CAN BE SEEN IN:       -UNSTABLE ANGINA       -CONGESTIVE HEART FAILURE       -MYOCARDITIS       -CHEST TRAUMA       -ARRYHTHMIAS       -LATE PRESENTING MYOCARDIAL INFARCTION       -COPD   CLINICAL FOLLOW-UP RECOMMENDED.   CK     Status: Abnormal   Collection Time: 11/28/14 11:52 AM  Result Value Ref Range   Total CK 2829 (H) 49 - 397 U/L    Comment: HEMOLYSIS AT THIS LEVEL MAY AFFECT RESULT    Ct Head Wo Contrast  11/27/2014   CLINICAL DATA:  Found unconscious at home.  Left-sided weakness.   EXAM: CT HEAD WITHOUT CONTRAST  TECHNIQUE: Contiguous axial images were obtained from the base of the skull through the vertex without intravenous contrast.  COMPARISON:  None.  FINDINGS: Stable age related cerebral atrophy, ventriculomegaly and periventricular white matter disease. No extra-axial fluid collections are identified. No CT findings for acute hemispheric infarction or intracranial hemorrhage. No mass lesions. There is and age indeterminate right thalamic lacunar type infarct. The brainstem and cerebellum are normal.  No acute bony findings. The paranasal sinuses and mastoid air cells are clear.  IMPRESSION: 1. Age related  cerebral atrophy, ventriculomegaly and periventricular white matter disease. 2. No findings for hemispheric infarction or intracranial hemorrhage. 3. Age-indeterminate right thymic lacunar type infarct. MRI with diffusion weighted imaging may be helpful for further evaluation.   Electronically Signed   By: Marijo Sanes M.D.   On: 11/27/2014 12:50   Mr Angiogram Neck W Wo Contrast  11/28/2014   ADDENDUM REPORT: 11/28/2014 12:36  ADDENDUM: Study discussed by telephone with Dr. Carlynn Spry On 11/28/2014 at 1222 hours.   Electronically Signed   By: Genevie Ann M.D.   On: 11/28/2014 12:36   11/28/2014   CLINICAL DATA:  81 year old male found unconscious, left side weakness. Facial droop. Initial encounter.  EXAM: MRI HEAD WITHOUT CONTRAST  MRA HEAD WITHOUT CONTRAST  MRA NECK WITHOUT AND WITH CONTRAST  TECHNIQUE: Multiplanar, multiecho pulse sequences of the brain and surrounding structures were obtained without and with intravenous contrast. Angiographic images of the Circle of Willis were obtained using MRA technique without intravenous contrast. Angiographic images of the neck were obtained using MRA technique without and with intravenous contrast. Carotid stenosis measurements (when applicable) are obtained utilizing NASCET criteria, using the distal internal carotid diameter as the  denominator.  CONTRAST:  49m MULTIHANCE GADOBENATE DIMEGLUMINE 529 MG/ML IV SOLN  COMPARISON:  Head CT without contrast 11/27/2014.  FINDINGS: MRI HEAD FINDINGS  Confluent restricted diffusion throughout the right MCA territory. Insula, temporal lobe, and operculum involvement predominate. There is also a restricted diffusion at the right cauda thalamic groove. Cytotoxic edema in the affected territory. No significant mass effect at this time. No hemorrhagic transformation; a micro hemorrhage in the anterior right frontal lobe on series 9, image 18 appears to be chronic/unrelated.  No definite restricted diffusion in the left hemisphere (suspect artifact on series 100, image 38). No posterior fossa restricted diffusion. Major intracranial vascular flow voids are preserved.  Outside of the acute findings, there are chronic lacunar infarcts in the right basal ganglia an the left thalamus. Mild for age nonspecific cerebral white matter T2 and FLAIR hyperintensity elsewhere. No ventriculomegaly, extra-axial collection or acute intracranial hemorrhage. Cervicomedullary junction and pituitary are within normal limits.  Mastoids are clear. Trace paranasal sinus mucosal thickening. Orbit and scalp soft tissues within normal limits. Nonspecific decreased T1 marrow signal in the cervical spine, skull bone marrow signal is normal. Mild for age cervical spine degeneration.  MRA NECK FINDINGS  Precontrast time-of-flight imaging is degraded by magnetic susceptibility artifact, and mildly degraded by motion. There is antegrade flow in both carotid and vertebral arteries in the neck. Both carotid bifurcations appear patent.  Post-contrast MRA imaging reveals a bovine arch configuration. No great vessel origin stenosis.  Tortuous right CCA. Widely patent right carotid bifurcation. No cervical right ICA stenosis. Abnormal right ICA terminus, see below.  Mildly tortuous left CCA. Widely patent left carotid bifurcation. Tortuous but  otherwise negative cervical left ICA.  No proximal subclavian artery stenosis. The right vertebral artery origin appears normal, the left is tortuous and less well visualized. Codominant vertebral arteries nonetheless appear patent throughout the neck without other stenosis.  MRA HEAD FINDINGS  Artifactual signal loss at the skullbase, with preserved antegrade flow in both distal vertebral arteries and at the vertebrobasilar junction. No basilar artery stenosis. SCA and PCA origins are patent. Moderate to severe bilateral PCA P2 segment stenosis with preserved distal bilateral PCA flow.  Symmetric petrous apex segment ICA flow bilaterally, however decreased cavernous and supra clinoid right ICA flow and occlusion at the right ICA terminus. No  reconstituted right MCA flow signal.  Antegrade flow in the left ICA siphon with patent left ICA terminus. Normal MCA and ACA origins. The right ACA probably is primarily supplied via the left. Motion artifact degrades bilateral ACA and left MCA branch detail. The left M1 segment is patent. No proximal ACA occlusion. Irregularity of left MCA M2 branches with no definite M2 occlusion on that side.  IMPRESSION: 1. Acute right MCA territory infarct with cytotoxic edema but no hemorrhagic transformation or significant mass effect. 2. Emergent large vessel occlusion; occluded right ICA terminus and right MCA. 3. Reconstituted flow in the right ACA. Moderate intracranial atherosclerosis elsewhere including in the bilateral PCA and left MCA branches. 4. No carotid stenosis in the neck.  Electronically Signed: By: Genevie Ann M.D. On: 11/28/2014 11:12   Mr Brain Wo Contrast  11/28/2014   ADDENDUM REPORT: 11/28/2014 12:36  ADDENDUM: Study discussed by telephone with Dr. Carlynn Spry On 11/28/2014 at 1222 hours.   Electronically Signed   By: Genevie Ann M.D.   On: 11/28/2014 12:36   11/28/2014   CLINICAL DATA:  81 year old male found unconscious, left side weakness. Facial droop. Initial encounter.   EXAM: MRI HEAD WITHOUT CONTRAST  MRA HEAD WITHOUT CONTRAST  MRA NECK WITHOUT AND WITH CONTRAST  TECHNIQUE: Multiplanar, multiecho pulse sequences of the brain and surrounding structures were obtained without and with intravenous contrast. Angiographic images of the Circle of Willis were obtained using MRA technique without intravenous contrast. Angiographic images of the neck were obtained using MRA technique without and with intravenous contrast. Carotid stenosis measurements (when applicable) are obtained utilizing NASCET criteria, using the distal internal carotid diameter as the denominator.  CONTRAST:  33m MULTIHANCE GADOBENATE DIMEGLUMINE 529 MG/ML IV SOLN  COMPARISON:  Head CT without contrast 11/27/2014.  FINDINGS: MRI HEAD FINDINGS  Confluent restricted diffusion throughout the right MCA territory. Insula, temporal lobe, and operculum involvement predominate. There is also a restricted diffusion at the right cauda thalamic groove. Cytotoxic edema in the affected territory. No significant mass effect at this time. No hemorrhagic transformation; a micro hemorrhage in the anterior right frontal lobe on series 9, image 18 appears to be chronic/unrelated.  No definite restricted diffusion in the left hemisphere (suspect artifact on series 100, image 38). No posterior fossa restricted diffusion. Major intracranial vascular flow voids are preserved.  Outside of the acute findings, there are chronic lacunar infarcts in the right basal ganglia an the left thalamus. Mild for age nonspecific cerebral white matter T2 and FLAIR hyperintensity elsewhere. No ventriculomegaly, extra-axial collection or acute intracranial hemorrhage. Cervicomedullary junction and pituitary are within normal limits.  Mastoids are clear. Trace paranasal sinus mucosal thickening. Orbit and scalp soft tissues within normal limits. Nonspecific decreased T1 marrow signal in the cervical spine, skull bone marrow signal is normal. Mild for age  cervical spine degeneration.  MRA NECK FINDINGS  Precontrast time-of-flight imaging is degraded by magnetic susceptibility artifact, and mildly degraded by motion. There is antegrade flow in both carotid and vertebral arteries in the neck. Both carotid bifurcations appear patent.  Post-contrast MRA imaging reveals a bovine arch configuration. No great vessel origin stenosis.  Tortuous right CCA. Widely patent right carotid bifurcation. No cervical right ICA stenosis. Abnormal right ICA terminus, see below.  Mildly tortuous left CCA. Widely patent left carotid bifurcation. Tortuous but otherwise negative cervical left ICA.  No proximal subclavian artery stenosis. The right vertebral artery origin appears normal, the left is tortuous and less well visualized. Codominant vertebral arteries  nonetheless appear patent throughout the neck without other stenosis.  MRA HEAD FINDINGS  Artifactual signal loss at the skullbase, with preserved antegrade flow in both distal vertebral arteries and at the vertebrobasilar junction. No basilar artery stenosis. SCA and PCA origins are patent. Moderate to severe bilateral PCA P2 segment stenosis with preserved distal bilateral PCA flow.  Symmetric petrous apex segment ICA flow bilaterally, however decreased cavernous and supra clinoid right ICA flow and occlusion at the right ICA terminus. No reconstituted right MCA flow signal.  Antegrade flow in the left ICA siphon with patent left ICA terminus. Normal MCA and ACA origins. The right ACA probably is primarily supplied via the left. Motion artifact degrades bilateral ACA and left MCA branch detail. The left M1 segment is patent. No proximal ACA occlusion. Irregularity of left MCA M2 branches with no definite M2 occlusion on that side.  IMPRESSION: 1. Acute right MCA territory infarct with cytotoxic edema but no hemorrhagic transformation or significant mass effect. 2. Emergent large vessel occlusion; occluded right ICA terminus and  right MCA. 3. Reconstituted flow in the right ACA. Moderate intracranial atherosclerosis elsewhere including in the bilateral PCA and left MCA branches. 4. No carotid stenosis in the neck.  Electronically Signed: By: Genevie Ann M.D. On: 11/28/2014 11:12   Mr Lovenia Kim  11/28/2014   ADDENDUM REPORT: 11/28/2014 12:36  ADDENDUM: Study discussed by telephone with Dr. Carlynn Spry On 11/28/2014 at 1222 hours.   Electronically Signed   By: Genevie Ann M.D.   On: 11/28/2014 12:36   11/28/2014   CLINICAL DATA:  81 year old male found unconscious, left side weakness. Facial droop. Initial encounter.  EXAM: MRI HEAD WITHOUT CONTRAST  MRA HEAD WITHOUT CONTRAST  MRA NECK WITHOUT AND WITH CONTRAST  TECHNIQUE: Multiplanar, multiecho pulse sequences of the brain and surrounding structures were obtained without and with intravenous contrast. Angiographic images of the Circle of Willis were obtained using MRA technique without intravenous contrast. Angiographic images of the neck were obtained using MRA technique without and with intravenous contrast. Carotid stenosis measurements (when applicable) are obtained utilizing NASCET criteria, using the distal internal carotid diameter as the denominator.  CONTRAST:  82m MULTIHANCE GADOBENATE DIMEGLUMINE 529 MG/ML IV SOLN  COMPARISON:  Head CT without contrast 11/27/2014.  FINDINGS: MRI HEAD FINDINGS  Confluent restricted diffusion throughout the right MCA territory. Insula, temporal lobe, and operculum involvement predominate. There is also a restricted diffusion at the right cauda thalamic groove. Cytotoxic edema in the affected territory. No significant mass effect at this time. No hemorrhagic transformation; a micro hemorrhage in the anterior right frontal lobe on series 9, image 18 appears to be chronic/unrelated.  No definite restricted diffusion in the left hemisphere (suspect artifact on series 100, image 38). No posterior fossa restricted diffusion. Major intracranial vascular flow voids  are preserved.  Outside of the acute findings, there are chronic lacunar infarcts in the right basal ganglia an the left thalamus. Mild for age nonspecific cerebral white matter T2 and FLAIR hyperintensity elsewhere. No ventriculomegaly, extra-axial collection or acute intracranial hemorrhage. Cervicomedullary junction and pituitary are within normal limits.  Mastoids are clear. Trace paranasal sinus mucosal thickening. Orbit and scalp soft tissues within normal limits. Nonspecific decreased T1 marrow signal in the cervical spine, skull bone marrow signal is normal. Mild for age cervical spine degeneration.  MRA NECK FINDINGS  Precontrast time-of-flight imaging is degraded by magnetic susceptibility artifact, and mildly degraded by motion. There is antegrade flow in both carotid and vertebral arteries in  the neck. Both carotid bifurcations appear patent.  Post-contrast MRA imaging reveals a bovine arch configuration. No great vessel origin stenosis.  Tortuous right CCA. Widely patent right carotid bifurcation. No cervical right ICA stenosis. Abnormal right ICA terminus, see below.  Mildly tortuous left CCA. Widely patent left carotid bifurcation. Tortuous but otherwise negative cervical left ICA.  No proximal subclavian artery stenosis. The right vertebral artery origin appears normal, the left is tortuous and less well visualized. Codominant vertebral arteries nonetheless appear patent throughout the neck without other stenosis.  MRA HEAD FINDINGS  Artifactual signal loss at the skullbase, with preserved antegrade flow in both distal vertebral arteries and at the vertebrobasilar junction. No basilar artery stenosis. SCA and PCA origins are patent. Moderate to severe bilateral PCA P2 segment stenosis with preserved distal bilateral PCA flow.  Symmetric petrous apex segment ICA flow bilaterally, however decreased cavernous and supra clinoid right ICA flow and occlusion at the right ICA terminus. No reconstituted  right MCA flow signal.  Antegrade flow in the left ICA siphon with patent left ICA terminus. Normal MCA and ACA origins. The right ACA probably is primarily supplied via the left. Motion artifact degrades bilateral ACA and left MCA branch detail. The left M1 segment is patent. No proximal ACA occlusion. Irregularity of left MCA M2 branches with no definite M2 occlusion on that side.  IMPRESSION: 1. Acute right MCA territory infarct with cytotoxic edema but no hemorrhagic transformation or significant mass effect. 2. Emergent large vessel occlusion; occluded right ICA terminus and right MCA. 3. Reconstituted flow in the right ACA. Moderate intracranial atherosclerosis elsewhere including in the bilateral PCA and left MCA branches. 4. No carotid stenosis in the neck.  Electronically Signed: By: Genevie Ann M.D. On: 11/28/2014 11:12    Review of Systems  Constitutional: Negative.   HENT: Negative.   Eyes: Negative.   Respiratory: Negative.   Cardiovascular: Negative.   Gastrointestinal: Negative.   Genitourinary: Negative.   Musculoskeletal: Negative.   Skin: Negative.   Neurological: Positive for loss of consciousness. Negative for dizziness, tingling, tremors, sensory change, speech change, focal weakness and seizures.  Endo/Heme/Allergies: Negative.   Psychiatric/Behavioral: Negative.    Blood pressure 152/74, pulse 74, temperature 98.1 F (36.7 C), temperature source Oral, resp. rate 18, height 5' 8"  (1.727 m), weight 68.901 kg (151 lb 14.4 oz), SpO2 95 %. Physical Exam  Nursing note and vitals reviewed. Constitutional: He appears well-developed and well-nourished. No distress.  HENT:  Head: Normocephalic and atraumatic.  Right Ear: External ear normal.  Left Ear: External ear normal.  Nose: Nose normal.  Mouth/Throat: Oropharynx is clear and moist.  Eyes: Conjunctivae and EOM are normal. Pupils are equal, round, and reactive to light. No scleral icterus.  Neck: Normal range of motion. Neck  supple.  Cardiovascular: Normal rate, regular rhythm, normal heart sounds and intact distal pulses.   Respiratory: Effort normal and breath sounds normal. He has no wheezes.  GI: Soft. Bowel sounds are normal. He exhibits no distension.  Musculoskeletal: Normal range of motion.  Neurological:  MS:  Alert and oriented x 3, moderate dysarthria, no aphasia, follows CN:  PERRLA, EOMI, L homonymous hemianopsia, L droop, tongue midline Motor:  5/5 R, 0/5 L UE, 1/5 L LE Cerebellar:  FTN and HTS WNL on R, untestable on L DTR's:  1+/4 B, babinski L, down on R Sensory;  Severe neglect of L, decrease temp and touch on L  NIHSS: 15  Skin: Skin is warm and dry. He  is not diaphoretic.   MRI of brain personally reviewed by me and shows inferior M2 R infarct with mild edema  Assessment/Plan: 1.  R inferior M2 infarct-  Moderate deficits and likely due to underlying atrial fibrillation with dilated atrium on echo cardiogram.   Pt does have some cerebral edema which is mild considering the atrophy and pt is expected to live through this infarct but will have persistent deficits of L hemiparesis and neglect. 2.  Intracranial atherosclerosis-  Not controlled -  Start ASA 359m per rectum -  HOB flat x 24 hours -  Agree with NS at 150cc/hr -  Start Lipitor 233mqHS until LDL returns -  Permissive HTN and would not treat unless greater than 220/120 -  Would avoid heparin as this can cause hemorrhage -  Drop NGT tomorrow -  Will follow closely with you  Emmette Katt 11/28/2014, 9:33 PM

## 2014-11-28 NOTE — Plan of Care (Signed)
Problem: Discharge/Transitional Outcomes Goal: Other Discharge Outcomes/Goals Outcome: Progressing Plan of care progress to goal:  No complaints of pain. Cardiac monitoring continues. NPO except medications crushed in puree. Neuro checks and vital signs every 4 hours. Neuro consulted. Awaiting cardiology consult. IV fluids infusing at 150 ml/hr. Echo, [place] and MRI completed this shift - resulted acute right infarct. Family at bedside.

## 2014-11-28 NOTE — Progress Notes (Signed)
*  PRELIMINARY RESULTS* Echocardiogram 2D Echocardiogram has been performed.  Zachary Leonard 11/28/2014, 10:08 AM

## 2014-11-28 NOTE — Evaluation (Signed)
Clinical/Bedside Swallow Evaluation Patient Details  Name: Zachary Leonard MRN: [id number] Date of Birth: 06/10/1931  Today's Date: 11/28/2014 Time: SLP Start Time (ACUTE ONLY): 67 SLP Stop Time (ACUTE ONLY): 1632 SLP Time Calculation (min) (ACUTE ONLY): 42 min  Past Medical History:  Past Medical History  Diagnosis Date  . Cancer     prostate, treated with XRT  . HTN (hypertension)    Past Surgical History:  Past Surgical History  Procedure Laterality Date  . No past surgeries     HPI:  Pt is a 81 y.o. male who presents with left-sided weakness and left facial droop. Patient was found down at home for an unknown period of time by a minister early this morning. He states that he fell last night when his "legs gave way". He denies any symptoms of chest pain, shortness of breath, headache, visual changes, diaphoresis, abdominal pain. However, he states that once he fallen he was unable to use the left side of his body, and was therefore unable to get himself up. He states that initially his right arm and leg felt weak as well. On evaluation in the ED patient has a mildly positive troponin, an elevated CPK, persistent left hemiparesis. Per chart notes, MRI revealed Acute right MCA territory infarct with cytotoxic edema but no hemorrhagic transformation or significant mass effect; large vessel occlusion(ICA, MCA). Pt is currently NPO.    Assessment / Plan / Recommendation Clinical Impression  Pt appears to present w/ increased risk for aspiration at this time; overt s/s of aspiration noted w/ trials of thin liquids. Pt exhibites some impulsivity and decreased awareness as he attempted to talk w/ bolus material in his mouth including thin liquids which spilled anteriorly from mouth followed by pt coughing moderately. Oral phase c/b decreased labial strength/ROM for closure on the utensil to pull bolus material from the spoon effectively; slower A-P transit and slight lingual residue noted as well.  When given time and verbal cues for pt to look LEFT during po trials/swallowing, pt appeared to adequately tolerate trials of puree(small) and single ice chips. Do not recommend pt initiate an oral diet at this time but instead to take trial ice chips and tsps of Puree w/ NSG supervision and strict aspiration precautions(posted) to encourage swallowing to improve swallow function overall. MD consulted and agreed. NSG updated w/ precautions and pt's status. Rec. meds could be given Crushed in puree if able(MD agreed).    Aspiration Risk  Moderate    Diet Recommendation Ice chips PRN after oral care (trials of puree w/ NSG)   Medication Administration: Crushed with puree (as able) Compensations: Minimize environmental distractions;Small sips/bites;Check for pocketing;Slow rate;Multiple dry swallows after each bite/sip    Other  Recommendations Recommended Consults:  (TBD) Oral Care Recommendations: Oral care BID;Oral care before and after PO;Staff/trained caregiver to provide oral care Other Recommendations:  (TBD)   Follow Up Recommendations       Frequency and Duration min 3x week  1 week   Pertinent Vitals/Pain denied    SLP Swallow Goals  see care plan  Swallow Study Prior Functional Status   lived at home alone; unsure of pt's ability to care for self, ADLs and diet.    General Date of Onset: 11/27/14 Other Pertinent Information: Pt is a 81 y.o. male who presents with left-sided weakness and left facial droop. Patient was found down at home for an unknown period of time by a minister early this morning. He states that he fell last night  when his "legs gave way". He denies any symptoms of chest pain, shortness of breath, headache, visual changes, diaphoresis, abdominal pain. However, he states that once he fallen he was unable to use the left side of his body, and was therefore unable to get himself up. He states that initially his right arm and leg felt weak as well. On evaluation in  the ED patient has a mildly positive troponin, an elevated CPK, persistent left hemiparesis. Per chart notes, MRI revealed Acute right MCA territory infarct with cytotoxic edema but no hemorrhagic transformation or significant mass effect; large vessel occlusion(ICA, MCA). Pt is currently NPO.  Type of Study: Bedside swallow evaluation Previous Swallow Assessment: none Diet Prior to this Study: Regular (per pt; currently NPO) Temperature Spikes Noted: No (wbc 12.4) Respiratory Status: Room air History of Recent Intubation: No Behavior/Cognition: Alert;Cooperative;Pleasant mood;Confused;Impulsive;Distractible;Requires cueing Oral Cavity - Dentition: Missing dentition Self-Feeding Abilities: Total assist Patient Positioning: Upright in bed (tends to lean to his Left(left sided weakness)) Baseline Vocal Quality: Normal (dysarthria) Volitional Cough: Strong Volitional Swallow: Able to elicit    Oral/Motor/Sensory Function Overall Oral Motor/Sensory Function: Impaired Labial ROM: Reduced left Labial Symmetry: Abnormal symmetry left Labial Strength: Reduced Labial Sensation: Reduced Lingual ROM: Reduced left Lingual Symmetry: Abnormal symmetry left (deviates to Left) Lingual Strength: Reduced Lingual Sensation: Reduced Facial ROM: Reduced left Facial Symmetry: Left droop Velum:  (NT) Mandible: Within Functional Limits   Ice Chips Ice chips: Impaired Presentation: Spoon (fed; 5 trials) Oral Phase Impairments: Reduced labial seal;Reduced lingual movement/coordination;Impaired anterior to posterior transit (min. impulsive to talk b/f swallowing bolus) Oral Phase Functional Implications: Prolonged oral transit Pharyngeal Phase Impairments:  (none)   Thin Liquid Thin Liquid: Impaired Presentation: Spoon (fed; 3 trials) Oral Phase Impairments: Reduced labial seal;Reduced lingual movement/coordination;Poor awareness of bolus Oral Phase Functional Implications: Left anterior spillage;Prolonged  oral transit Pharyngeal  Phase Impairments: Cough - Immediate;Cough - Delayed (x2/3 trials)    Nectar Thick Nectar Thick Liquid: Not tested   Honey Thick Honey Thick Liquid: Not tested   Puree Puree: Impaired Presentation: Spoon (fed; 6 trials(1/2 tsp)) Oral Phase Impairments: Reduced labial seal;Reduced lingual movement/coordination;Impaired anterior to posterior transit Oral Phase Functional Implications: Prolonged oral transit;Oral residue (slight on Left) Pharyngeal Phase Impairments:  (none)   Solid   GO    Solid: Not tested      Orinda Kenner, MS, CCC-SLP  Makinzi Prieur 11/28/2014,4:32 PM

## 2014-11-28 NOTE — Progress Notes (Signed)
PT Cancellation Note  Patient Details Name: Kosta Schnitzler MRN: [id number] DOB: 1932/03/18   Cancelled Treatment:    Reason Eval/Treat Not Completed: Patient at procedure or test/unavailable (Consult received and chart reviewed.  Patient currently off unit for diagnostic imaging.  Will re-attempt at later time as patient medically appropriate and available.)   Garner Dullea H. Owens Shark, PT, DPT, NCS 11/28/2014, 8:43 AM [phone]

## 2014-11-28 NOTE — Progress Notes (Signed)
Pt voided incontinent- will continue to try to collect urine specimen.

## 2014-11-28 NOTE — Consult Note (Signed)
[facility] Cardiology  CARDIOLOGY CONSULT NOTE  Patient ID: Zachary Leonard MRN: [id number] DOB/AGE: [birth year] 81 y.o.  Admit date: 11/27/2014 Referring Physician Lance Coon M.D. Primary Physician Primary Cardiologist Reason for Consultation elevated troponin  HPI: The patient is an 81 year old gentleman who was admitted with chief complaint of left facial droop, slurred speech and left-sided weakness. She was brought to [facility] emergency room after he was found at home on the floor. Patient reports that he was unable to get up after falling and was on the floor for an undetermined time. In the emergency room head CT was suggestive of acute cerebral infarct. Admission labs were notable for borderline elevated troponin of 0.15 with a total CPK 3134. EKG was nondiagnostic. The patient denies chest pain prior to the stroke or currently. 2-D echocardiogram revealed normal left ventricular function with LV ejection fraction of 60-65% without regional wall motion abnormalities.  Review of systems complete and found to be negative unless listed above     Past Medical History  Diagnosis Date  . Cancer     prostate, treated with XRT  . HTN (hypertension)     Past Surgical History  Procedure Laterality Date  . No past surgeries      Prescriptions prior to admission  Medication Sig Dispense Refill Last Dose  . aspirin EC 81 MG tablet Take 81 mg by mouth daily.   unknown at unknown  . lisinopril-hydrochlorothiazide (PRINZIDE,ZESTORETIC) 10-12.5 MG per tablet Take 1 tablet by mouth daily.   unknown at unknown  . Multiple Vitamin (MULTIVITAMIN WITH MINERALS) TABS tablet Take 1 tablet by mouth daily.   unknown at unknown   Social History   Social History  . Marital Status: Single    Spouse Name: N/A  . Number of Children: N/A  . Years of Education: N/A   Occupational History  . Not on file.   Social History Main Topics  . Smoking status: Never Smoker   . Smokeless tobacco: Not on file  . Alcohol  Use: No     Comment: quit 10-12 years ago, prior drank 1 glass of wine daily  . Drug Use: No  . Sexual Activity: Not on file   Other Topics Concern  . Not on file   Social History Narrative  . No narrative on file    Family History  Problem Relation Age of Onset  . Hypertension        Review of systems complete and found to be negative unless listed above      PHYSICAL EXAM  General: Well developed, well nourished, in no acute distress HEENT:  Normocephalic and atramatic Neck:  No JVD.  Lungs: Clear bilaterally to auscultation and percussion. Heart: HRRR . Normal S1 and S2 without gallops or murmurs.  Abdomen: Bowel sounds are positive, abdomen soft and non-tender  Msk:  Back normal, normal gait. Normal strength and tone for age. Extremities: No clubbing, cyanosis or edema.   Neuro: Alert and oriented X 3, the patient had obvious slurred speech Psych:  Good affect, responds appropriately  Labs:   Lab Results  Component Value Date   WBC 12.4* 11/28/2014   HGB 13.5 11/28/2014   HCT 40.1 11/28/2014   MCV 92.9 11/28/2014   PLT 110* 11/28/2014    Recent Labs Lab 11/27/14 1207  11/28/14 0416  NA 140  < > 140  K 4.2  < > 4.0  CL 104  < > 108  CO2 22  < > 23  BUN 30*  < >  31*  CREATININE 1.79*  < > 1.64*  CALCIUM 9.5  < > 8.5*  PROT 7.5  --   --   BILITOT 1.2  --   --   ALKPHOS 73  --   --   ALT 23  --   --   AST 70*  --   --   GLUCOSE 121*  < > 89  < > = values in this interval not displayed. Lab Results  Component Value Date   CKTOTAL 2829* 11/28/2014   TROPONINI 0.13* 11/28/2014    Lab Results  Component Value Date   CHOL 166 11/28/2014   Lab Results  Component Value Date   HDL 47 11/28/2014   Lab Results  Component Value Date   LDLCALC 109* 11/28/2014   Lab Results  Component Value Date   TRIG 50 11/28/2014   Lab Results  Component Value Date   CHOLHDL 3.5 11/28/2014   No results found for: LDLDIRECT    Radiology: Ct Head Wo  Contrast  11/27/2014   CLINICAL DATA:  Found unconscious at home.  Left-sided weakness.  EXAM: CT HEAD WITHOUT CONTRAST  TECHNIQUE: Contiguous axial images were obtained from the base of the skull through the vertex without intravenous contrast.  COMPARISON:  None.  FINDINGS: Stable age related cerebral atrophy, ventriculomegaly and periventricular white matter disease. No extra-axial fluid collections are identified. No CT findings for acute hemispheric infarction or intracranial hemorrhage. No mass lesions. There is and age indeterminate right thalamic lacunar type infarct. The brainstem and cerebellum are normal.  No acute bony findings. The paranasal sinuses and mastoid air cells are clear.  IMPRESSION: 1. Age related cerebral atrophy, ventriculomegaly and periventricular white matter disease. 2. No findings for hemispheric infarction or intracranial hemorrhage. 3. Age-indeterminate right thymic lacunar type infarct. MRI with diffusion weighted imaging may be helpful for further evaluation.   Electronically Signed   By: Marijo Sanes M.D.   On: 11/27/2014 12:50   Mr Angiogram Neck W Wo Contrast  11/28/2014   ADDENDUM REPORT: 11/28/2014 12:36  ADDENDUM: Study discussed by telephone with Dr. Carlynn Spry On 11/28/2014 at 1222 hours.   Electronically Signed   By: Genevie Ann M.D.   On: 11/28/2014 12:36   11/28/2014   CLINICAL DATA:  81 year old male found unconscious, left side weakness. Facial droop. Initial encounter.  EXAM: MRI HEAD WITHOUT CONTRAST  MRA HEAD WITHOUT CONTRAST  MRA NECK WITHOUT AND WITH CONTRAST  TECHNIQUE: Multiplanar, multiecho pulse sequences of the brain and surrounding structures were obtained without and with intravenous contrast. Angiographic images of the Circle of Willis were obtained using MRA technique without intravenous contrast. Angiographic images of the neck were obtained using MRA technique without and with intravenous contrast. Carotid stenosis measurements (when applicable) are  obtained utilizing NASCET criteria, using the distal internal carotid diameter as the denominator.  CONTRAST:  59mL MULTIHANCE GADOBENATE DIMEGLUMINE 529 MG/ML IV SOLN  COMPARISON:  Head CT without contrast 11/27/2014.  FINDINGS: MRI HEAD FINDINGS  Confluent restricted diffusion throughout the right MCA territory. Insula, temporal lobe, and operculum involvement predominate. There is also a restricted diffusion at the right cauda thalamic groove. Cytotoxic edema in the affected territory. No significant mass effect at this time. No hemorrhagic transformation; a micro hemorrhage in the anterior right frontal lobe on series 9, image 18 appears to be chronic/unrelated.  No definite restricted diffusion in the left hemisphere (suspect artifact on series 100, image 38). No posterior fossa restricted diffusion. Major intracranial vascular flow  voids are preserved.  Outside of the acute findings, there are chronic lacunar infarcts in the right basal ganglia an the left thalamus. Mild for age nonspecific cerebral white matter T2 and FLAIR hyperintensity elsewhere. No ventriculomegaly, extra-axial collection or acute intracranial hemorrhage. Cervicomedullary junction and pituitary are within normal limits.  Mastoids are clear. Trace paranasal sinus mucosal thickening. Orbit and scalp soft tissues within normal limits. Nonspecific decreased T1 marrow signal in the cervical spine, skull bone marrow signal is normal. Mild for age cervical spine degeneration.  MRA NECK FINDINGS  Precontrast time-of-flight imaging is degraded by magnetic susceptibility artifact, and mildly degraded by motion. There is antegrade flow in both carotid and vertebral arteries in the neck. Both carotid bifurcations appear patent.  Post-contrast MRA imaging reveals a bovine arch configuration. No great vessel origin stenosis.  Tortuous right CCA. Widely patent right carotid bifurcation. No cervical right ICA stenosis. Abnormal right ICA terminus, see  below.  Mildly tortuous left CCA. Widely patent left carotid bifurcation. Tortuous but otherwise negative cervical left ICA.  No proximal subclavian artery stenosis. The right vertebral artery origin appears normal, the left is tortuous and less well visualized. Codominant vertebral arteries nonetheless appear patent throughout the neck without other stenosis.  MRA HEAD FINDINGS  Artifactual signal loss at the skullbase, with preserved antegrade flow in both distal vertebral arteries and at the vertebrobasilar junction. No basilar artery stenosis. SCA and PCA origins are patent. Moderate to severe bilateral PCA P2 segment stenosis with preserved distal bilateral PCA flow.  Symmetric petrous apex segment ICA flow bilaterally, however decreased cavernous and supra clinoid right ICA flow and occlusion at the right ICA terminus. No reconstituted right MCA flow signal.  Antegrade flow in the left ICA siphon with patent left ICA terminus. Normal MCA and ACA origins. The right ACA probably is primarily supplied via the left. Motion artifact degrades bilateral ACA and left MCA branch detail. The left M1 segment is patent. No proximal ACA occlusion. Irregularity of left MCA M2 branches with no definite M2 occlusion on that side.  IMPRESSION: 1. Acute right MCA territory infarct with cytotoxic edema but no hemorrhagic transformation or significant mass effect. 2. Emergent large vessel occlusion; occluded right ICA terminus and right MCA. 3. Reconstituted flow in the right ACA. Moderate intracranial atherosclerosis elsewhere including in the bilateral PCA and left MCA branches. 4. No carotid stenosis in the neck.  Electronically Signed: By: Genevie Ann M.D. On: 11/28/2014 11:12   Mr Brain Wo Contrast  11/28/2014   ADDENDUM REPORT: 11/28/2014 12:36  ADDENDUM: Study discussed by telephone with Dr. Carlynn Spry On 11/28/2014 at 1222 hours.   Electronically Signed   By: Genevie Ann M.D.   On: 11/28/2014 12:36   11/28/2014   CLINICAL DATA:   81 year old male found unconscious, left side weakness. Facial droop. Initial encounter.  EXAM: MRI HEAD WITHOUT CONTRAST  MRA HEAD WITHOUT CONTRAST  MRA NECK WITHOUT AND WITH CONTRAST  TECHNIQUE: Multiplanar, multiecho pulse sequences of the brain and surrounding structures were obtained without and with intravenous contrast. Angiographic images of the Circle of Willis were obtained using MRA technique without intravenous contrast. Angiographic images of the neck were obtained using MRA technique without and with intravenous contrast. Carotid stenosis measurements (when applicable) are obtained utilizing NASCET criteria, using the distal internal carotid diameter as the denominator.  CONTRAST:  50mL MULTIHANCE GADOBENATE DIMEGLUMINE 529 MG/ML IV SOLN  COMPARISON:  Head CT without contrast 11/27/2014.  FINDINGS: MRI HEAD FINDINGS  Confluent restricted diffusion  throughout the right MCA territory. Insula, temporal lobe, and operculum involvement predominate. There is also a restricted diffusion at the right cauda thalamic groove. Cytotoxic edema in the affected territory. No significant mass effect at this time. No hemorrhagic transformation; a micro hemorrhage in the anterior right frontal lobe on series 9, image 18 appears to be chronic/unrelated.  No definite restricted diffusion in the left hemisphere (suspect artifact on series 100, image 38). No posterior fossa restricted diffusion. Major intracranial vascular flow voids are preserved.  Outside of the acute findings, there are chronic lacunar infarcts in the right basal ganglia an the left thalamus. Mild for age nonspecific cerebral white matter T2 and FLAIR hyperintensity elsewhere. No ventriculomegaly, extra-axial collection or acute intracranial hemorrhage. Cervicomedullary junction and pituitary are within normal limits.  Mastoids are clear. Trace paranasal sinus mucosal thickening. Orbit and scalp soft tissues within normal limits. Nonspecific decreased  T1 marrow signal in the cervical spine, skull bone marrow signal is normal. Mild for age cervical spine degeneration.  MRA NECK FINDINGS  Precontrast time-of-flight imaging is degraded by magnetic susceptibility artifact, and mildly degraded by motion. There is antegrade flow in both carotid and vertebral arteries in the neck. Both carotid bifurcations appear patent.  Post-contrast MRA imaging reveals a bovine arch configuration. No great vessel origin stenosis.  Tortuous right CCA. Widely patent right carotid bifurcation. No cervical right ICA stenosis. Abnormal right ICA terminus, see below.  Mildly tortuous left CCA. Widely patent left carotid bifurcation. Tortuous but otherwise negative cervical left ICA.  No proximal subclavian artery stenosis. The right vertebral artery origin appears normal, the left is tortuous and less well visualized. Codominant vertebral arteries nonetheless appear patent throughout the neck without other stenosis.  MRA HEAD FINDINGS  Artifactual signal loss at the skullbase, with preserved antegrade flow in both distal vertebral arteries and at the vertebrobasilar junction. No basilar artery stenosis. SCA and PCA origins are patent. Moderate to severe bilateral PCA P2 segment stenosis with preserved distal bilateral PCA flow.  Symmetric petrous apex segment ICA flow bilaterally, however decreased cavernous and supra clinoid right ICA flow and occlusion at the right ICA terminus. No reconstituted right MCA flow signal.  Antegrade flow in the left ICA siphon with patent left ICA terminus. Normal MCA and ACA origins. The right ACA probably is primarily supplied via the left. Motion artifact degrades bilateral ACA and left MCA branch detail. The left M1 segment is patent. No proximal ACA occlusion. Irregularity of left MCA M2 branches with no definite M2 occlusion on that side.  IMPRESSION: 1. Acute right MCA territory infarct with cytotoxic edema but no hemorrhagic transformation or  significant mass effect. 2. Emergent large vessel occlusion; occluded right ICA terminus and right MCA. 3. Reconstituted flow in the right ACA. Moderate intracranial atherosclerosis elsewhere including in the bilateral PCA and left MCA branches. 4. No carotid stenosis in the neck.  Electronically Signed: By: Genevie Ann M.D. On: 11/28/2014 11:12   Mr Lovenia Kim  11/28/2014   ADDENDUM REPORT: 11/28/2014 12:36  ADDENDUM: Study discussed by telephone with Dr. Carlynn Spry On 11/28/2014 at 1222 hours.   Electronically Signed   By: Genevie Ann M.D.   On: 11/28/2014 12:36   11/28/2014   CLINICAL DATA:  81 year old male found unconscious, left side weakness. Facial droop. Initial encounter.  EXAM: MRI HEAD WITHOUT CONTRAST  MRA HEAD WITHOUT CONTRAST  MRA NECK WITHOUT AND WITH CONTRAST  TECHNIQUE: Multiplanar, multiecho pulse sequences of the brain and surrounding structures were obtained  without and with intravenous contrast. Angiographic images of the Circle of Willis were obtained using MRA technique without intravenous contrast. Angiographic images of the neck were obtained using MRA technique without and with intravenous contrast. Carotid stenosis measurements (when applicable) are obtained utilizing NASCET criteria, using the distal internal carotid diameter as the denominator.  CONTRAST:  66mL MULTIHANCE GADOBENATE DIMEGLUMINE 529 MG/ML IV SOLN  COMPARISON:  Head CT without contrast 11/27/2014.  FINDINGS: MRI HEAD FINDINGS  Confluent restricted diffusion throughout the right MCA territory. Insula, temporal lobe, and operculum involvement predominate. There is also a restricted diffusion at the right cauda thalamic groove. Cytotoxic edema in the affected territory. No significant mass effect at this time. No hemorrhagic transformation; a micro hemorrhage in the anterior right frontal lobe on series 9, image 18 appears to be chronic/unrelated.  No definite restricted diffusion in the left hemisphere (suspect artifact on series  100, image 38). No posterior fossa restricted diffusion. Major intracranial vascular flow voids are preserved.  Outside of the acute findings, there are chronic lacunar infarcts in the right basal ganglia an the left thalamus. Mild for age nonspecific cerebral white matter T2 and FLAIR hyperintensity elsewhere. No ventriculomegaly, extra-axial collection or acute intracranial hemorrhage. Cervicomedullary junction and pituitary are within normal limits.  Mastoids are clear. Trace paranasal sinus mucosal thickening. Orbit and scalp soft tissues within normal limits. Nonspecific decreased T1 marrow signal in the cervical spine, skull bone marrow signal is normal. Mild for age cervical spine degeneration.  MRA NECK FINDINGS  Precontrast time-of-flight imaging is degraded by magnetic susceptibility artifact, and mildly degraded by motion. There is antegrade flow in both carotid and vertebral arteries in the neck. Both carotid bifurcations appear patent.  Post-contrast MRA imaging reveals a bovine arch configuration. No great vessel origin stenosis.  Tortuous right CCA. Widely patent right carotid bifurcation. No cervical right ICA stenosis. Abnormal right ICA terminus, see below.  Mildly tortuous left CCA. Widely patent left carotid bifurcation. Tortuous but otherwise negative cervical left ICA.  No proximal subclavian artery stenosis. The right vertebral artery origin appears normal, the left is tortuous and less well visualized. Codominant vertebral arteries nonetheless appear patent throughout the neck without other stenosis.  MRA HEAD FINDINGS  Artifactual signal loss at the skullbase, with preserved antegrade flow in both distal vertebral arteries and at the vertebrobasilar junction. No basilar artery stenosis. SCA and PCA origins are patent. Moderate to severe bilateral PCA P2 segment stenosis with preserved distal bilateral PCA flow.  Symmetric petrous apex segment ICA flow bilaterally, however decreased cavernous  and supra clinoid right ICA flow and occlusion at the right ICA terminus. No reconstituted right MCA flow signal.  Antegrade flow in the left ICA siphon with patent left ICA terminus. Normal MCA and ACA origins. The right ACA probably is primarily supplied via the left. Motion artifact degrades bilateral ACA and left MCA branch detail. The left M1 segment is patent. No proximal ACA occlusion. Irregularity of left MCA M2 branches with no definite M2 occlusion on that side.  IMPRESSION: 1. Acute right MCA territory infarct with cytotoxic edema but no hemorrhagic transformation or significant mass effect. 2. Emergent large vessel occlusion; occluded right ICA terminus and right MCA. 3. Reconstituted flow in the right ACA. Moderate intracranial atherosclerosis elsewhere including in the bilateral PCA and left MCA branches. 4. No carotid stenosis in the neck.  Electronically Signed: By: Genevie Ann M.D. On: 11/28/2014 11:12    EKG: Normal sinus rhythm  ASSESSMENT AND PLAN:  1. Borderline elevated troponin, without peak or trough, and the absence of chest pain, with nondiagnostic ECG, normal left ventricular function by echocardiogram likely representing demand supply ischemia in the setting of acute CVA 2. Acute CVA with clinical improvement in the setting of markedly elevated blood pressure 3. Elevated total CK, out of proportion to the likely representing noncardiac muscle necrosis  Recommendations  1. Defer invasive cardiac evaluation at this time 2. Defer full dose anticoagulation 3. Agree with aspirin therapy at this time 4. Defer cardiac diagnostics at this time  Signed: Henry Utsey MD,PhD, [facility] 11/28/2014, 4:05 PM

## 2014-11-28 NOTE — Progress Notes (Signed)
Dr. Reece Levy notified of critical troponin of 0.13. Acknowledge, no new orders at this time.

## 2014-11-28 NOTE — Progress Notes (Signed)
Dr. Reece Levy notified of small amount of blood coming from patients penis after voiding. Continue to monitor and inform rounding MD per Dr. Reece Levy.

## 2014-11-28 NOTE — Progress Notes (Signed)
PT Cancellation Note  Patient Details Name: Zachary Leonard MRN: [id number] DOB: February 27, 1932   Cancelled Treatment:    Reason Eval/Treat Not Completed: Medical issues which prohibited therapy (Noted MRI report significant for R MCA infarct with surrounding cytotoxic edema (no mass effect presently).  Will hold PT services at this time until neuro consult complete and patient fully cleared for activity.)  Gissela Bloch H. Owens Shark, PT, DPT, NCS 11/28/2014, 12:52 PM [phone]

## 2014-11-28 NOTE — Progress Notes (Signed)
OT Cancellation Note  Patient Details Name: Deantae Shackleton MRN: [id number] DOB: 1931-11-01   Cancelled Treatment:    Reason Eval/Treat Not Completed: Medical issues which prohibited therapy (Per Physical Therapy discussion with neurologist (Dr. Jayme Cloud), patient to be on strict, flat bedrest x24 hours prior to initiation of any activity. Will re-attempt next date as medically appropriate.)  Sharon Mt 11/28/2014, 3:10 PM
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
## 2014-11-28 NOTE — Progress Notes (Signed)
PT Cancellation Note  Patient Details Name: Zachary Leonard MRN: [id number] DOB: 1932/03/24   Cancelled Treatment:    Reason Eval/Treat Not Completed: Medical issues which prohibited therapy (Per discussion with neurologist (Dr. Jayme Cloud), patient to be on strict, flat bedrest x24 hours prior to initiation of any activity.  Will re-attempt next date as medically appropriate.)   [name] H. Owens Shark, PT, DPT, NCS 11/28/2014, 2:40 PM [phone]

## 2014-11-28 NOTE — Plan of Care (Addendum)
Problem: Discharge/Transitional Outcomes Goal: Other Discharge Outcomes/Goals Plan of care progress to goal: - No complaints of pain. - Continues IV fluids. - Stroke patient, NIH score 17. - Incontinent of urine at times. - Noted small amount of blood coming from patients penis after voiding, VSS,  MD notified. Will continue to monitor.
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
## 2014-11-28 NOTE — Care Management (Signed)
Admitted to [facility] with the diagnosis CVA. Lives alone. Sister is Cameron Sprang [phone]). Goes to [facility] in [place] for care. States he served in [place]. States he was last at [facility] about one month ago to see his primary care physician. Doesn't remember name of physician.  Noted that Mr. Witt signed transfer papers when in the emergency room  Yesterday. Spoke with Mr. Bonnin at the bedside today, States he doesn't want to be transferred to [facility]. Discussed that [facility] may not pay for his hospitalization here. Still does not want to be transferred, Refusal of Transfer to [facility] signed and witnessed. Faxed to [phone]. Telephone call to [employer] at [facility]. [phone] ext [phone] Shelbie Ammons RN MSN Care Management [phone]

## 2014-11-29 DIAGNOSIS — R471 Dysarthria and anarthria: Secondary | ICD-10-CM

## 2014-11-29 LAB — BASIC METABOLIC PANEL
ANION GAP: 5 (ref 5–15)
BUN: 30 mg/dL — ABNORMAL HIGH (ref 6–20)
CALCIUM: 8.2 mg/dL — AB (ref 8.9–10.3)
CO2: 23 mmol/L (ref 22–32)
CREATININE: 1.42 mg/dL — AB (ref 0.61–1.24)
Chloride: 113 mmol/L — ABNORMAL HIGH (ref 101–111)
GFR, EST AFRICAN AMERICAN: 52 mL/min — AB (ref >60)
GFR, EST NON AFRICAN AMERICAN: 45 mL/min — AB (ref >60)
GLUCOSE: 80 mg/dL (ref 65–99)
Potassium: 4.1 mmol/L (ref 3.5–5.1)
Sodium: 141 mmol/L (ref 135–145)

## 2014-11-29 LAB — CBC
HEMATOCRIT: 39.1 % — AB (ref 40.0–52.0)
Hemoglobin: 13.1 g/dL (ref 13.0–18.0)
MCH: 31.3 pg (ref 26.0–34.0)
MCHC: 33.5 g/dL (ref 32.0–36.0)
MCV: 93.4 fL (ref 80.0–100.0)
PLATELETS: 108 10*3/uL — AB (ref 150–440)
RBC: 4.19 MIL/uL — ABNORMAL LOW (ref 4.40–5.90)
RDW: 13.9 % (ref 11.5–14.5)
WBC: 8.3 10*3/uL (ref 3.8–10.6)

## 2014-11-29 LAB — CK
CK TOTAL: 1873 U/L — AB (ref 49–397)
CK TOTAL: 1617 U/L — AB (ref 49–397)
Total CK: 1791 U/L — ABNORMAL HIGH (ref 49–397)
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
## 2014-11-29 NOTE — Plan of Care (Signed)
Problem: Discharge/Transitional Outcomes Goal: Other Discharge Outcomes/Goals Outcome: Progressing Patient had no c/o pain continues IV NS at 150 Stroke patient, NIH score 17. Incontinent of urine at times.   Speech saw patient at advanced diet to Dsy I meds crushed in puree, patient at 90 degrees aspriation precautions in place  Patient is tolerating new diet well, OT also worked with patient today  Patient is to be transferred to [place] when bed available  Continue to assess

## 2014-11-29 NOTE — Care Management (Addendum)
Zachary Leonard has decided that he would like to be transferred to [facility]. Will fax information to [facility] hospital. [phone] Telephone call to [phone] ext [phone]. Spoke to [place]. Will fax face sheet to [facility]. [phone]. Spoke with [facility] representative. Will need to be called back with receiving doctor and bed assignment when determined at [phone]. Transfer form placed in packet. Shelbie Ammons RN MSN Care Management [phone]

## 2014-11-29 NOTE — Progress Notes (Signed)
OT Cancellation Note  Patient Details Name: Zachary Leonard MRN: [id number] DOB: 1931/09/23   Cancelled Treatment:    Reason Eval/Treat Not Completed: Medical issues which prohibited therapy  (Patient remains within 24 hours of bedrest recommended by neurology. Will continue hold until neurology rounds and clears patient for full PT evaluation.)  Sharon Mt 11/29/2014, 9:58 AM

## 2014-11-29 NOTE — Progress Notes (Signed)
Called Dr Brigitte Pulse told him patients ECG was showed a PAC, PVC earlier with NSR , per MD continue to monitor  He also said that is patient cant tolerate meds crushed in puree with can change dietary order

## 2014-11-29 NOTE — Progress Notes (Signed)
Speech Language Pathology Treatment: Dysphagia  Patient Details Name: Keshav Winegar MRN: [id number] DOB: 07/10/1931 Today's Date: 11/29/2014 Time: 0045-9977 SLP Time Calculation (min) (ACUTE ONLY): 60 min  Assessment / Plan / Recommendation Clinical Impression  Pt appeared to adequately tolerate trials of Nectar liquids and purees w/ no overt s/s of aspiration noted; no change in vocal quality or decline in respiratory status noted during/post trials. Pt appeared to present w/ a more timely and controlled oropharyngeal phase w/ po consistencies above but when given trials of thin liquids via tsp, he exhibited a less timely oral transfer and min. less coordinated swallow(more audible) w/ min. anterior leakage noted. Pt demo. adequate oral clearing w/ purees and Nectar liquids given time b/t trials to f/u swallow/clear though noted less lingual sweeping/movement to Left side. Pt's mouth opening appears decreased slightly when accepting boluses. Pt required feeding assistance and verbal cues to look ahead or min. Left when swallowing. He was distracted to stim in room and required cues to maintain attention to task. He exhibited confusion re: awareness of self stating he was wearing his dentures when he was not. Pt appears at increased risk for aspiration sec. to Neurological status at this time. Rec. Dysphagia diet w/ aspiration precautions; meds in puree; feeding assistance at meals for safety w/ oral intake. MD/NSG updated.    HPI Other Pertinent Information: Pt is a 81 y.o. male who presents with left-sided weakness and left facial droop. Patient was found down at home for an unknown period of time by a minister early this morning. He states that he fell last night when his "legs gave way". He denies any symptoms of chest pain, shortness of breath, headache, visual changes, diaphoresis, abdominal pain. However, he states that once he fallen he was unable to use the left side of his body, and was therefore  unable to get himself up. He states that initially his right arm and leg felt weak as well. On evaluation in the ED patient has a mildly positive troponin, an elevated CPK, persistent left hemiparesis. Per chart notes, MRI revealed Acute right MCA territory infarct with cytotoxic edema but no hemorrhagic transformation or significant mass effect; large vessel occlusion(ICA, MCA). Pt is currently NPO. NSG stated pt has not declined in Neuro status; MD(Neurologist) agreed. Pt continues to answer to basic questions w/ short answer or y/n. He is distractible but responds to cues. He does have confusion stating his dentures were in place but they are NOT. He has a fixed stare ahead but not immediately to object. He can turn to the Left when cued and acknowledged SLP on his Left.    Pertinent Vitals Pain Assessment: No/denies pain  SLP Plan  Continue with current plan of care    Recommendations Diet recommendations: Dysphagia 1 (puree);Nectar-thick liquid Liquids provided via: Cup;Straw Medication Administration: Crushed with puree Supervision: Staff to assist with self feeding;Full supervision/cueing for compensatory strategies Compensations: Minimize environmental distractions;Slow rate;Small sips/bites;Check for pocketing;Follow solids with liquid Postural Changes and/or Swallow Maneuvers: Seated upright 90 degrees              General recommendations:  (Dietician) Oral Care Recommendations: Oral care BID;Oral care before and after PO;Staff/trained caregiver to provide oral care Follow up Recommendations: Skilled Nursing facility Plan: Continue with current plan of care    [place], [place], CCC-SLP  Geniece Akers 11/29/2014, 3:21 PM

## 2014-11-29 NOTE — Clinical Social Work Note (Signed)
Clinical Social Work Assessment  Patient Details  Name: Zachary Leonard MRN: [id number] Date of Birth: 23-Sep-1931  Date of referral:  11/29/14               Reason for consult:  Facility Placement                Permission sought to share information with:  Family Supports, Chartered certified accountant granted to share information::  Yes, Verbal Permission Granted  Name::        Agency::     Relationship::     Contact Information:     Housing/Transportation Living arrangements for the past 2 months:   (home) Source of Information:  Patient Patient Interpreter Needed:  None Criminal Activity/Legal Involvement Pertinent to Current Situation/Hospitalization:  No - Comment as needed Significant Relationships:  Siblings Lives with:    Do you feel safe going back to the place where you live?  Yes Need for family participation in patient care:  Yes (Comment)  Care giving concerns:  Patient resides alone.    Social Worker assessment / plan:  CSW met with patient this morning and he stated that he lives alone and that he would agree to going to rehab. Patient is able to communicate to CSW that he realizes he is not strong enough to return home at this time. Patient tells CSW that he only has [facility] and does not have his card with him. CSW is aware that one nursing home facility in [facility] has a [place]. CSW will also call the [place] in [place] to see if they will take patient's referral. PT currently pending. Patient gave me permission to call his sister: Zachary Leonard: [phone]. CSW attempted to call her but had to leave a message at the number provided.   RN CM is in discussion with the patient regarding transferring to the [facility] hospital where his care would be covered.   Employment status:  Retired Forensic scientist:  [employer] PT Recommendations:  [place] / Referral to community resources:  [place]  Patient/Family's Response to care:  Patient expressed appreciation for CSW involvement.  Patient/Family's Understanding of and Emotional Response to Diagnosis, Current Treatment, and Prognosis:  Patient agreeable to looking into rehab. Emotional Assessment Appearance:  Appears stated age Attitude/Demeanor/Rapport:   (pleasant and cooperative) Affect (typically observed):  Accepting, Adaptable, Appropriate Orientation:  Oriented to Self, Oriented to Place, Oriented to Situation Alcohol / Substance use:  Not Applicable Psych involvement (Current and /or in the community):  No (Comment)  Discharge Needs  Concerns to be addressed:  Care Coordination Readmission within the last 30 days:  No Current discharge risk:  None Barriers to Discharge:  No Barriers Identified   Zachary Leff, LCSW 11/29/2014, 10:57 AM
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
## 2014-11-29 NOTE — Progress Notes (Addendum)
NEUROLOGY NOTE  S: no complaints and still denies weakness on L;  Denies headache  ROS neg x 8 systems  O: 98.3 176/76 58 20 Nl weight, no distress Normocephalic, oropharynx clear Supple, nl JVD CTA B, good movement RRR, no murmur No C/C/E  A+Ox3, moderate dysarthria, nl  language PERRLA, EOMI, R homonymous hemianopsia, L droop 5/5 R, 0/5 L   A/P: 1. 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  Intracranial atherosclerosis- stable -  Can start moving today with therapy -  Keep permissive HTN and do not treat unless > 220/120, can gradually start meds next week -  Continue IVF for 24 hours after PO route is established -  Start plavix 75mg  x 3 months then stop once PO route is established -  Continue ASA 81mg  PO or 300mg  PR -  Continue lipitor 20mg  PO -  Needs loop recorder for 90 days -  Will sign off, please call with further questions -  Needs to f/u with Neurology in 3 months

## 2014-11-29 NOTE — Progress Notes (Signed)
PT Cancellation Note  Patient Details Name: Schylar Wuebker MRN: [id number] DOB: 01-14-1932   Cancelled Treatment:    Reason Eval/Treat Not Completed: Medical issues which prohibited therapy (Patient remains within 24 hours of bedrest recommended by neurology.  Will continue hold until neurology rounds and clears patient for full PT evaluation.)   Haisley Arens H. Owens Shark, PT, DPT, NCS 11/29/2014, 8:25 AM [phone]

## 2014-11-29 NOTE — Plan of Care (Signed)
Problem: Discharge/Transitional Outcomes Goal: Other Discharge Outcomes/Goals Plan of care progress to goal: - No complaints of pain. - Continues IV fluids. - Stroke patient, NIH score 17. - Incontinent of urine at times. Will continue to monitor.

## 2014-11-29 NOTE — Discharge Summary (Addendum)
[place] at [place] NAME: Zachary Leonard    MR#:  [id number]  DATE OF BIRTH:  1931/11/18  DATE OF ADMISSION:  11/27/2014 ADMITTING PHYSICIAN: Lance Coon, MD  DATE OF DISCHARGE: 12/01/2014   PRIMARY CARE PHYSICIAN: No primary care provider on file.   ADMISSION DIAGNOSIS:  Elevated troponin [R79.89] NSTEMI (non-ST elevated myocardial infarction) [I21.4] Left-sided weakness [M62.89] Cerebral infarction due to unspecified mechanism [I63.9] Traumatic rhabdomyolysis, initial encounter [T79.6XXA] DISCHARGE DIAGNOSIS:  Principal Problem:   CVA (cerebral infarction) Active Problems:   Benign essential HTN   Elevated troponin   Rhabdomyolysis   AKI (acute kidney injury)   Cerebral infarction due to unspecified mechanism acute right MCA territory infarct with cytotoxic edema, also has occluded right ICA terminus and right MCA SECONDARY DIAGNOSIS:   Past Medical History  Diagnosis Date  . Cancer     prostate, treated with XRT  . HTN (hypertension)    HOSPITAL COURSE:  81 y.o. male with a known history of hypertension, was admitted for left-sided weakness and left facial droop. He was found to have acute stroke confirmed by MRI showing right MCA territory infarct with cytotoxic edema with occlusion of the right ICA terminus and right MCA.  Please see Dr.Willis's dictated history and physical for further details.  Patient was evaluated by neurology who recommended  - Plavix 75mg  x 3 months then stop once PO route is established - Continue ASA 81mg  PO or 300mg  PR - Continue lipitor 20mg  PO - Needs loop recorder for 90 days  He was evaluated by physical, occupational and speech therapy and will need rehabilitation.at this point. Patient has a bed available at [facility] where he is being discharged in stable condition.  He is agreeable with the discharge plans.  DISCHARGE CONDITIONS:  Fair CONSULTS OBTAINED:   Treatment Team:  Isaias Cowman, MD Valora Corporal, MD Palliative care DRUG ALLERGIES:  No Known Allergies DISCHARGE MEDICATIONS:   Current Discharge Medication List    START taking these medications   Details  atorvastatin (LIPITOR) 20 MG tablet Take 1 tablet (20 mg total) by mouth daily at 6 PM. Qty: 30 tablet, Refills: 0    clopidogrel (PLAVIX) 75 MG tablet Take 1 tablet (75 mg total) by mouth daily. Qty: 30 tablet, Refills: 0      CONTINUE these medications which have NOT CHANGED   Details  aspirin EC 81 MG tablet Take 81 mg by mouth daily.    lisinopril-hydrochlorothiazide (PRINZIDE,ZESTORETIC) 10-12.5 MG per tablet Take 1 tablet by mouth daily.    Multiple Vitamin (MULTIVITAMIN WITH MINERALS) TABS tablet Take 1 tablet by mouth daily.       DISCHARGE INSTRUCTIONS:   DIET:  Cardiac diet DISCHARGE CONDITION:  Fair ACTIVITY:  Activity as tolerated OXYGEN:  Home Oxygen: No.  Oxygen Delivery: room air DISCHARGE LOCATION:  rehabilitation -   If you experience worsening of your admission symptoms, develop shortness of breath, life threatening emergency, suicidal or homicidal thoughts you must seek medical attention immediately by calling 911 or calling your MD immediately  if symptoms less severe.  You Must read complete instructions/literature along with all the possible adverse reactions/side effects for all the Medicines you take and that have been prescribed to you. Take any new Medicines after you have completely understood and accpet all the possible adverse reactions/side effects.   Please note  You were cared for by a hospitalist during your hospital stay. If you have any questions about  your discharge medications or the care you received while you were in the hospital after you are discharged, you can call the unit and asked to speak with the hospitalist on call if the hospitalist that took care of you is not available. Once you are discharged, your  primary care physician will handle any further medical issues. Please note that NO REFILLS for any discharge medications will be authorized once you are discharged, as it is imperative that you return to your primary care physician (or establish a relationship with a primary care physician if you do not have one) for your aftercare needs so that they can reassess your need for medications and monitor your lab values.    On the day of Discharge: VITAL SIGNS:  Blood pressure 162/69, pulse 50, temperature 98.4 F (36.9 C), temperature source Oral, resp. rate 17, height 5\' 8"  (1.727 m), weight 68.901 kg (151 lb 14.4 oz), SpO2 99 %. PHYSICAL EXAMINATION:  GENERAL:  81 y.o.-year-old patient lying in the bed with no acute distress.  EYES: Pupils equal, round, reactive to light and accommodation. No scleral icterus. Extraocular muscles intact.  HEENT: Head atraumatic, normocephalic. Oropharynx and nasopharynx clear.  NECK:  Supple, no jugular venous distention. No thyroid enlargement, no tenderness.  LUNGS: Normal breath sounds bilaterally, no wheezing, rales,rhonchi or crepitation. No use of accessory muscles of respiration.  CARDIOVASCULAR: S1, S2 normal. No murmurs, rubs, or gallops.  ABDOMEN: Soft, non-tender, non-distended. Bowel sounds present. No organomegaly or mass.  EXTREMITIES: No pedal edema, cyanosis, or clubbing.  NEUROLOGIC: A+Ox3, moderate dysarthria, nl language. PERRLA, EOMI, R homonymous hemianopsia, L droop 5/5 R, 0/5 L PSYCHIATRIC: The patient is alert and oriented x 3.  SKIN: No obvious rash, lesion, or ulcer.  DATA REVIEW:   CBC  Recent Labs Lab 12/01/14 0522  WBC 8.0  HGB 10.6*  HCT 31.0*  PLT 79*    Chemistries   Recent Labs Lab 11/27/14 1207  12/01/14 0522  NA 140  < > 143  K 4.2  < > 3.6  CL 104  < > 116*  CO2 22  < > 24  GLUCOSE 121*  < > 100*  BUN 30*  < > 16  CREATININE 1.79*  < > 1.03  CALCIUM 9.5  < > 7.9*  AST 70*  --   --   ALT 23  --   --    ALKPHOS 73  --   --   BILITOT 1.2  --   --   < > = values in this interval not displayed.  Cardiac Enzymes  Recent Labs Lab 11/28/14 1152  TROPONINI 0.09*    RADIOLOGY:  No results found.   Management plans discussed with the patient and he is in agreement.  CODE STATUS: FULL CODE  TOTAL TIME TAKING CARE OF THIS PATIENT: 55 minutes.    [facility], Duncan Alejandro M.D on 12/01/2014 at 9:08 AM  Between 7am to 6pm - Pager - [phone]  After 6pm go to [email or web address] - password [email or web address] [facility]  [facility]  Office  [phone]  CC: Primary care physician; No primary care provider on file. Jennings Books MD

## 2014-11-29 NOTE — Progress Notes (Signed)
[place] at [place] NAME: Zachary Leonard    MR#:  [id number]  DATE OF BIRTH:  11/27/1931  SUBJECTIVE:  CHIEF COMPLAINT:   Chief Complaint  Patient presents with  . Fall   somewhat more alert today. left-sided hemiparesis - unchanged REVIEW OF SYSTEMS:  Review of Systems  Constitutional: Negative for fever, weight loss, malaise/fatigue and diaphoresis.  HENT: Negative for ear discharge, ear pain, hearing loss, nosebleeds, sore throat and tinnitus.   Eyes: Negative for blurred vision and pain.  Respiratory: Negative for cough, hemoptysis, shortness of breath and wheezing.   Cardiovascular: Negative for chest pain, palpitations, orthopnea and leg swelling.  Gastrointestinal: Negative for heartburn, nausea, vomiting, abdominal pain, diarrhea, constipation and blood in stool.  Genitourinary: Negative for dysuria, urgency and frequency.  Musculoskeletal: Negative for myalgias and back pain.  Skin: Negative for itching and rash.  Neurological: Positive for focal weakness (left sided hemiparesis). Negative for dizziness, tingling, tremors, seizures, weakness and headaches.  Psychiatric/Behavioral: Negative for depression. The patient is not nervous/anxious.    DRUG ALLERGIES:  No Known Allergies VITALS:  Blood pressure 176/76, pulse 58, temperature 98.3 F (36.8 C), temperature source Oral, resp. rate 20, height 5\' 8"  (1.727 m), weight 68.901 kg (151 lb 14.4 oz), SpO2 97 %. PHYSICAL EXAMINATION:  Physical Exam  Constitutional: He is oriented to person, place, and time. Vital signs are normal. He appears malnourished and dehydrated. He appears unhealthy. He appears cachectic. He appears toxic.  HENT:  Head: Normocephalic and atraumatic.  Eyes: Conjunctivae and EOM are normal. Pupils are equal, round, and reactive to light.  Neck: Normal range of motion. Neck supple. No tracheal deviation present. No thyromegaly present.  Cardiovascular:  Normal rate, regular rhythm and normal heart sounds.   Pulmonary/Chest: Effort normal and breath sounds normal. No respiratory distress. He has no wheezes. He exhibits no tenderness.  Abdominal: Soft. Bowel sounds are normal. He exhibits no distension. There is no tenderness.  Musculoskeletal: Normal range of motion.  Neurological: He is oriented to person, place, and time. He displays weakness (left sided hemiparesis) and facial asymmetry (left facial droop). No cranial nerve deficit.  Skin: Skin is warm and dry. No rash noted.  Long toenails  Psychiatric: Mood and affect normal.   LABORATORY PANEL:   CBC  Recent Labs Lab 11/29/14 0448  WBC 8.3  HGB 13.1  HCT 39.1*  PLT 108*   ------------------------------------------------------------------------------------------------------------------ Chemistries   Recent Labs Lab 11/27/14 1207  11/29/14 0448  NA 140  < > 141  K 4.2  < > 4.1  CL 104  < > 113*  CO2 22  < > 23  GLUCOSE 121*  < > 80  BUN 30*  < > 30*  CREATININE 1.79*  < > 1.42*  CALCIUM 9.5  < > 8.2*  AST 70*  --   --   ALT 23  --   --   ALKPHOS 73  --   --   BILITOT 1.2  --   --   < > = values in this interval not displayed. RADIOLOGY:  No results found. ASSESSMENT AND PLAN:   * CVA (cerebral infarction) - MRI confirmed acute right MCA territory infarct with cytotoxic edema, also has occluded right ICA terminus and right MCA.  Appreciate neurology consultation.ST is working with him  -  aggressive physical and occupational therapy at this time - Keep permissive HTN and do not treat unless > 220/120, can gradually start  meds next week - Start plavix 75mg  x 3 months then stop once PO route is established per neurology - Continue ASA 81mg  PO or 300mg  PR - Continue lipitor 20mg  PO - Needs loop recorder for 90 days on discharge  * Benign essential HTN - hold antihypertensives with goal blood pressure as above for now   * Elevated troponin - potentially  due to his stroke, or rhabdo - secondary to supply demand ischemia, appreciated cardiology consult, echo normal  * intermittent PACs and PVCs: keep potassium close to 4 and magnesium close to 2.  Monitor, cardiology seen  * Rhabdomyolysis - improving with aggressive IV hydration  * AKI (acute kidney injury) - unknown baseline, the presumed to be normal as patient has no history of kidney disease, possibly due to his rhabdo, fluids as above, avoid nephrotoxins, monitor creatinine. It's improving.    All the records are reviewed and case discussed with Care Management/Social Worker. Management plans discussed with the patient and he is in agreement.  He is we have patient and is agreeable to be transferred to [facility].  Transfer process started.  Possible discharge once he is accepted they have a bed opening  CODE STATUS: Full Code  TOTAL TIME TAKING CARE OF THIS PATIENT: 35 minutes.   More than 50% of the time was spent in counseling/coordination of care: YES  POSSIBLE D/C IN 1-2 DAYS, DEPENDING ON CLINICAL CONDITION.   Will need rehabilitation per physical therapy assessment  poor prognosis.   [facility], Shyhiem Beeney M.D on 11/29/2014 at 2:39 PM  Between 7am to 6pm - Pager - [phone]  After 6pm go to [email or web address] - password [email or web address]  [place] [facility]  Office  [phone]  CC:  Primary care physician; No primary care provider on file.

## 2014-11-30 LAB — CK
CK TOTAL: 1362 U/L — AB (ref 49–397)
CK TOTAL: 1233 U/L — AB (ref 49–397)
Total CK: 1373 U/L — ABNORMAL HIGH (ref 49–397)

## 2014-11-30 NOTE — Progress Notes (Signed)
Initial Nutrition Assessment    INTERVENTION:   Meals and Snacks: Cater to patient preferences Medical Food Supplement Therapy: recommend addition of Magic Cup BID   NUTRITION DIAGNOSIS:   Swallowing difficulty related to acute illness as evidenced by  (dysphagia, acute CVA).   GOAL:   Patient will meet greater than or equal to 90% of their needs   MONITOR:    (Energy Intake, Anthropometrics, Digestive System)  REASON FOR ASSESSMENT:    (Dysphagia Diet)    ASSESSMENT:    Pt admitted with acute right MCA infarct, pt working with OT on visit today  Past Medical History  Diagnosis Date  . Cancer     prostate, treated with XRT  . HTN (hypertension)     Diet Order:  DIET - DYS 1 Room service appropriate?: Yes with Assist; Fluid consistency:: Nectar Thick   Energy Intake: pt reports good appetite; recorded po intake 25% at breakfast this AM, 75% at dinner. Per Junie Panning RN, pt ate puree waffle and some oatmeal at breakfast this AM  Food and nutrition related history: pt reports good appetite prior to admission  Electrolyte and Renal Profile:  Recent Labs Lab 11/27/14 1950 11/28/14 0416 11/29/14 0448  BUN 31* 31* 30*  CREATININE 1.73* 1.64* 1.42*  NA 139 140 141  K 4.2 4.0 4.1   Glucose Profile: No results for input(s): GLUCAP in the last 72 hours.  Lab Results  Component Value Date   HGBA1C 5.8 11/28/2014    Meds: NS at 150 ml/hr  Skin:  Reviewed, no issues  Last BM:  9/2  Height:   Ht Readings from Last 1 Encounters:  11/27/14 5\' 8"  (1.727 m)    Weight: pt reports wt has been stable; no previous wt encounters   Wt Readings from Last 1 Encounters:  11/27/14 151 lb 14.4 oz (68.901 kg)    Ideal Body Weight:     BMI:  Body mass index is 23.1 kg/(m^2).  LOW Care Level  Kerman Passey MS, [place], LDN [phone] Pager

## 2014-11-30 NOTE — Progress Notes (Signed)
Speech Therapy Note: pt's diet was initiated yesterday. Reviewed chart notes; consulted NSG re: pt's status today and toleration of diet/meds. NSG reported good toleration of breakfast meal this AM during feeding w/ no overt s/s of aspiration noted. Rec. Continue w/ current dysphagia diet at this time; trials to upgrade to follow w/ ST services. NSG agreed.

## 2014-11-30 NOTE — Progress Notes (Signed)
[place] [facility] NAME: Zachary Leonard    MR#:  [id number]  DATE OF BIRTH:  10-May-1931  SUBJECTIVE:  CHIEF COMPLAINT:   Chief Complaint  Patient presents with  . Fall   much more alert today. left-sided hemiparesis - unchanged REVIEW OF SYSTEMS:  Review of Systems  Constitutional: Negative for fever, weight loss, malaise/fatigue and diaphoresis.  HENT: Negative for ear discharge, ear pain, hearing loss, nosebleeds, sore throat and tinnitus.   Eyes: Negative for blurred vision and pain.  Respiratory: Negative for cough, hemoptysis, shortness of breath and wheezing.   Cardiovascular: Negative for chest pain, palpitations, orthopnea and leg swelling.  Gastrointestinal: Negative for heartburn, nausea, vomiting, abdominal pain, diarrhea, constipation and blood in stool.  Genitourinary: Negative for dysuria, urgency and frequency.  Musculoskeletal: Negative for myalgias and back pain.  Skin: Negative for itching and rash.  Neurological: Positive for focal weakness (left sided hemiparesis). Negative for dizziness, tingling, tremors, seizures, weakness and headaches.  Psychiatric/Behavioral: Negative for depression. The patient is not nervous/anxious.    DRUG ALLERGIES:  No Known Allergies VITALS:  Blood pressure 133/66, pulse 50, temperature 98.2 F (36.8 C), temperature source Oral, resp. rate 18, height 5\' 8"  (1.727 m), weight 68.901 kg (151 lb 14.4 oz), SpO2 94 %. PHYSICAL EXAMINATION:  Physical Exam  Constitutional: He is oriented to person, place, and time. Vital signs are normal. He appears malnourished and dehydrated. He appears unhealthy. He appears cachectic. He appears toxic.  HENT:  Head: Normocephalic and atraumatic.  Eyes: Conjunctivae and EOM are normal. Pupils are equal, round, and reactive to light.  Neck: Normal range of motion. Neck supple. No tracheal deviation present. No thyromegaly present.  Cardiovascular: Normal  rate, regular rhythm and normal heart sounds.   Pulmonary/Chest: Effort normal and breath sounds normal. No respiratory distress. He has no wheezes. He exhibits no tenderness.  Abdominal: Soft. Bowel sounds are normal. He exhibits no distension. There is no tenderness.  Musculoskeletal: Normal range of motion.  Neurological: He is oriented to person, place, and time. He displays weakness (left sided hemiparesis) and facial asymmetry (left facial droop). No cranial nerve deficit.  Skin: Skin is warm and dry. No rash noted.  Long toenails  Psychiatric: Mood and affect normal.   LABORATORY PANEL:   CBC  Recent Labs Lab 11/29/14 0448  WBC 8.3  HGB 13.1  HCT 39.1*  PLT 108*   ------------------------------------------------------------------------------------------------------------------ Chemistries   Recent Labs Lab 11/27/14 1207  11/29/14 0448  NA 140  < > 141  K 4.2  < > 4.1  CL 104  < > 113*  CO2 22  < > 23  GLUCOSE 121*  < > 80  BUN 30*  < > 30*  CREATININE 1.79*  < > 1.42*  CALCIUM 9.5  < > 8.2*  AST 70*  --   --   ALT 23  --   --   ALKPHOS 73  --   --   BILITOT 1.2  --   --   < > = values in this interval not displayed. RADIOLOGY:  No results found. ASSESSMENT AND PLAN:   * CVA (cerebral infarction) - MRI confirmed acute right MCA territory infarct with cytotoxic edema, also has occluded right ICA terminus and right MCA.  Appreciate neurology consultation.ST is working with him  - Please start aggressive physical and occupational therapy in the hospital - Keep permissive HTN and do not treat unless > 220/120, can gradually  start meds next week - Start plavix 75mg  x 3 months then stop once PO route is established per neurology - Continue ASA 81mg  PO or 300mg  PR - Continue lipitor 20mg  PO - Needs loop recorder for 90 days on discharge  * Benign essential HTN - hold antihypertensives with goal blood pressure as above for now   * Elevated troponin -  potentially due to his stroke, or rhabdo - secondary to supply demand ischemia, appreciated cardiology consult, echo normal  * intermittent PACs and PVCs: keep potassium close to 4 and magnesium close to 2.  Monitor, cardiology seen  * Rhabdomyolysis - improving with aggressive IV hydration  * AKI (acute kidney injury) - unknown baseline, the presumed to be normal as patient has no history of kidney disease, possibly due to his rhabdo, fluids as above, avoid nephrotoxins, monitor creatinine. It's improving.    All the records are reviewed and case discussed with Care Management/Social Worker. Management plans discussed with the patient and he is in agreement.  He is we have patient and is agreeable to be transferred to [facility].  Transfer process started.  Possible discharge once he is accepted they have a bed opening  CODE STATUS: Full Code  TOTAL TIME TAKING CARE OF THIS PATIENT: 35 minutes.   More than 50% of the time was spent in counseling/coordination of care: YES  POSSIBLE D/C IN 1-2 DAYS, DEPENDING ON CLINICAL CONDITION.  Waiting for transfer to the [facility] and or rehabilitation placement.  I did call the [place] transfer center, but so far there is no physical Available and Is Going in the Voice Mail.  Message left for them to call back.  Will need rehabilitation per physical therapy assessment  poor prognosis.   [facility], Zachary Leonard M.D on 11/30/2014 at 8:20 AM  Between 7am to 6pm - Pager - [phone]  After 6pm go to [email or web address] - password [email or web address] [facility]  [facility] [place] [facility]  Office  [phone]  CC:  Primary care physician; No primary care provider on file.

## 2014-11-30 NOTE — Care Management Note (Signed)
Case Management Note  Patient Details  Name: Dewane Timson MRN: [id number] Date of Birth: Jan 07, 1932  Subjective/Objective:     Call to [facility] this morning and message left requesting an update on the status of this [facility] referral.               Action/Plan:   Expected Discharge Date:                  Expected Discharge Plan:     In-House Referral:     Discharge planning Services     Post Acute Care Choice:    Choice offered to:     DME Arranged:    DME Agency:     HH Arranged:    [place] Agency:     Status of Service:     Medicare Important Message Given:    Date Medicare IM Given:    Medicare IM give by:    Date Additional Medicare IM Given:    Additional Medicare Important Message give by:     If discussed at [place] of Stay Meetings, dates discussed:    Additional Comments:  Jacklynn Dehaas A, RN 11/30/2014, 9:57 AM

## 2014-11-30 NOTE — Plan of Care (Signed)
Problem: Discharge/Transitional Outcomes Goal: Other Discharge Outcomes/Goals Outcome: Progressing Plan of care progress to goal: CVA: Pt saw PT and OT today.  No c/o pain. NIH scale of 17. IVF infusing. Flaccid on left side.  Pt to be transferred to [facility] once bed is available.  Tele discontinued.

## 2014-11-30 NOTE — Progress Notes (Signed)
Clinical Social Worker (CSW) presented bed offers to patient and his sister [name] [phone]. Patient chose [facility]. [facility] CSW made [facility] admissions coordinator at [place] aware of above. Per Helene Kelp patient can be admitted tomorrow. CSW will continue to follow and assist as needed.   Blima Rich, [place] [phone]

## 2014-11-30 NOTE — Progress Notes (Signed)
PT Cancellation Note  Patient Details Name: Zachary Leonard MRN: [id number] DOB: July 21, 1931   Cancelled Treatment:    Reason Eval/Treat Not Completed: Patient declined, no reason specified;Other (comment) (Pt got a phone call after PT started and he forgot about PT).  Will try back as time allows.   Ramond Dial 11/30/2014, 11:25 AM   Mee Hives, PT MS Acute Rehab Dept. Number: [facility] [phone] and [place] [phone]

## 2014-11-30 NOTE — Progress Notes (Signed)
Occupational Therapy Treatment Patient Details Name: Zachary Leonard MRN: [id number] DOB: Oct 17, 1931 Today's Date: 11/30/2014    History of present illness This patient is an 81 year old male who came to [facility] with left sided weakness and facial droop.   OT comments  Patient put for good effort but unable to maintain sitting.  Follow Up Recommendations  SNF    Equipment Recommendations       Recommendations for Other Services      Precautions / Restrictions Precautions Precautions: Fall Restrictions Weight Bearing Restrictions: No       Mobility Bed Mobility Overal bed mobility: Needs Assistance (Max assist supine to sit and sit to supine, unable to maintain sitting at all.)                Transfers                      Balance                                   ADL                                                Vision                     Perception     Praxis      Cognition   Behavior During Therapy: [facility] for tasks assessed/performed                         Extremity/Trunk Assessment               Exercises  Mobilization of wrist and fingers, anti-edema massage given, tapping to bicep and wrist extensors with no results,    Shoulder Instructions       General Comments      Pertinent Vitals/ Pain       Pain Assessment: No/denies pain  Home Living                                          Prior Functioning/Environment              Frequency       Progress Toward Goals  OT Goals(current goals can now be found in the care plan section)        Plan      Co-evaluation                 End of Session     Activity Tolerance Patient limited by fatigue   Patient Left in bed;with call bell/phone within reach;with bed alarm set   Nurse Communication          Time: 4696-2952 OT Time Calculation (min): 18 min  Charges: OT General Charges $OT  Visit: 1 Procedure OT Evaluation $Initial OT Evaluation Tier I: 1 Procedure  Myrene Galas, MS/OTR/L  11/30/2014, 11:59 AM

## 2014-11-30 NOTE — Plan of Care (Signed)
Problem: Discharge/Transitional Outcomes Goal: Other Discharge Outcomes/Goals Plan of care progress to goal: Hemodynamically: VSS Mobility: flaccid on left side Diet: tolerating well Waiting on bed at [facility]

## 2014-12-01 LAB — CBC
HCT: 31 % — ABNORMAL LOW (ref 40.0–52.0)
Hemoglobin: 10.6 g/dL — ABNORMAL LOW (ref 13.0–18.0)
MCH: 31.7 pg (ref 26.0–34.0)
MCHC: 34.2 g/dL (ref 32.0–36.0)
MCV: 92.7 fL (ref 80.0–100.0)
PLATELETS: 79 10*3/uL — AB (ref 150–440)
RBC: 3.34 MIL/uL — AB (ref 4.40–5.90)
RDW: 14.2 % (ref 11.5–14.5)
WBC: 8 10*3/uL (ref 3.8–10.6)

## 2014-12-01 LAB — BASIC METABOLIC PANEL
ANION GAP: 3 — AB (ref 5–15)
BUN: 16 mg/dL (ref 6–20)
CALCIUM: 7.9 mg/dL — AB (ref 8.9–10.3)
CO2: 24 mmol/L (ref 22–32)
Chloride: 116 mmol/L — ABNORMAL HIGH (ref 101–111)
Creatinine, Ser: 1.03 mg/dL (ref 0.61–1.24)
GFR calc Af Amer: >60 mL/min (ref >60)
Glucose, Bld: 100 mg/dL — ABNORMAL HIGH (ref 65–99)
POTASSIUM: 3.6 mmol/L (ref 3.5–5.1)
SODIUM: 143 mmol/L (ref 135–145)

## 2014-12-01 LAB — CK: Total CK: 1099 U/L — ABNORMAL HIGH (ref 49–397)

## 2014-12-01 MED ORDER — CLOPIDOGREL BISULFATE 75 MG PO TABS: 75.0000 mg | ORAL_TABLET | Freq: Every day | ORAL | Status: DC

## 2014-12-01 MED ORDER — ATORVASTATIN CALCIUM 20 MG PO TABS: 20.0000 mg | ORAL_TABLET | Freq: Every day | ORAL | Status: DC

## 2014-12-01 NOTE — Discharge Instructions (Signed)
Ischemic Stroke Blood carries oxygen to all areas of your body. A stroke happens when your blood does not flow to your brain like normal. If this happens, your brain will not get the oxygen it needs and brain tissue will die. This is an emergency. Problems (symptoms) of a stroke usually happen suddenly. You may notice them when you wake up. They can include:  Loss of feeling or weakness on one side of the body (face, arm, leg).  Feeling confused.  Trouble talking or understanding.  Trouble seeing.  Trouble walking.  Feeling dizzy.  Loss of balance or coordination.  Severe headache without a cause.  Trouble reading or writing. Get help as soon as any of these problems first start. This is important.  RISK FACTORS  Risk factors are things that make it more likely for you to have a stroke. These things include:  High blood pressure (hypertension).  High cholesterol.  Diabetes.  Heart disease.  Having a buildup of fatty deposits in the blood vessels.  Having an abnormal heart rhythm (atrial fibrillation).  Being very overweight (obese).  Smoking.  Taking birth control pills, especially if you smoke.  Not being active.  Having a diet high in fats, salt, and calories.  Drinking too much alcohol.  Using illegal drugs.  Being African American.  Being over the age of 61.  Having a family history of stroke.  Having a history of blood clots, stroke, warning stroke (transient ischemic attack, TIA), or heart attack.  Sickle cell disease. HOME CARE  Take all medicines exactly as told by your doctor. Understand all your medicine instructions.  You may need to take a medicine to thin your blood, like aspirin or warfarin. Take warfarin exactly as told.  Taking too much or too little warfarin is dangerous. Get regular blood tests as told, including the PT and INR tests. The test results help your doctor adjust your dose of warfarin. Your PT and INR levels must be done  as often as told by your doctor.  Food can cause problems with warfarin and affect the results of your blood tests. This is true for foods high in vitamin K, such as spinach, kale, broccoli, cabbage, collard and turnip greens, Brussels sprouts, peas, cauliflower, seaweed, and parsley, as well as beef and pork liver, green tea, and soybean oil. Eat the same amount of food high in vitamin K. Avoid major changes in your diet. Tell your doctor before changing your diet. Talk to a food specialist (dietitian) if you have questions.  Many medicines can cause problems with warfarin and affect your PT and INR test results. Tell your doctor about all medicines you take. This includes vitamins and dietary pills (supplements). Be careful with aspirin and medicines that relieve redness, soreness, and puffiness (inflammation). Do not take or stop medicines unless your doctor tells you to.  Warfarin can cause a lot of bruising or bleeding. Hold pressure over cuts for longer than normal. Talk to your doctor about other side effects of warfarin.  Avoid sports or activities that may cause injury or bleeding.  Be careful when you shave, floss your teeth, or use sharp objects.  Avoid alcoholic drinks or drink very little alcohol while taking warfarin. Tell your doctor if you change how much alcohol you drink.  Tell your dentist and other doctors that you take warfarin before procedures.  If you are able to swallow, eat healthy foods. Eat 5 or more servings of fruits and vegetables a day. Eat soft  foods, pureed foods, or eat small bites of food so you do not choke.  Follow your diet program as told, if you are given one.  Keep a healthy weight.  Stay active. Try to get at least 30 minutes of activity on most or all days.  Do not smoke.  Limit how much alcohol you drink even if you are not taking warfarin. Moderate alcohol use is:  No more than 2 drinks each day for men.  No more than 1 drink each day for  women who are not pregnant.  Keep your home safe so you do not fall. Try:  Putting grab bars in the bedroom and bathroom.  Raising toilet seats.  Putting a seat in the shower.  Go to therapy sessions (physical, occupational, and speech) as told by your doctor.  Use a walker or cane at all times if told to do so.  Keep all doctor visits as told. GET HELP IF:  Your personality changes.  You have trouble swallowing.  You are seeing two of everything.  You are dizzy.  You have a fever.  Your skin starts to break down. GET HELP RIGHT AWAY IF:  The symptoms below may be a sign of an emergency. Do not wait to see if the symptoms go away. Call for help (911 in [place] Do not drive yourself to the hospital.  You have sudden weakness or numbness on the face, arm, or leg (especially on one side of the body).  You have sudden trouble walking or moving your arms or legs.  You have sudden confusion.  You have trouble talking or understanding.  You have sudden trouble seeing in one or both eyes.  You lose your balance or your movements are not smooth.  You have a sudden, severe headache with no known cause.  You have new chest pain or you feel your heart beating in an unsteady way.  You are partly or totally unaware of what is going on around you. Document Released: 03/04/2011 Document Revised: 07/30/2013 Document Reviewed: 10/24/2011 [facility] Patient Information 2015 [place], [place]. This information is not intended to replace advice given to you by your health care provider. Make sure you discuss any questions you have with your health care provider.  Rehabilitation After a Stroke A stroke can cause many types of problems. The treatment of stroke involves three stages: prevention, treatment immediately following a stroke, and rehabilitation after a stroke. HOW IS MY REHABILITATION PLAN DEVELOPED? A detailed exam by your health care provider helps outline what problems were  caused by the stroke. Your health care provider may consult specialists. The specialists may include doctors, occupational and physical therapists, and speech therapists. It is then possible to make a plan that best fits your needs.  Your evaluation might include the following:  Evaluation of your ability to do daily activities that require using muscles, coordination, vision, reasoning, memory, and problem solving. Interviews with you and your health care provider will help determine what you could do and could not do before the stroke.  Evaluation of your ability to do personal self-care tasks, such as dressing, grooming, and eating.  Tests to see if there are sensory and motor changes due to the stroke, especially in the hands and legs. WHAT ARE THE TYPES OF REHABILITATION? Your health care provider may have you start rehabilitation right away depending on the type and severity of your stroke. Rehabilitation after stroke is focused on getting function back and preventing another stroke. Rehabilitation might  include:   Physical therapy. This can include help with walking, sitting, lying down, and balance. It may also be designed to help prevent shortening of the muscles (contractures) and swelling (edema).  Occupational therapy. This therapy helps you to relearn skills needed for leading a normal life. These could include eating, using the restroom, dressing, and taking care of yourself. It helps to make you more independent.  Vision therapy. This can help you to retrain, strengthen, and improve your vision after a stroke.  Speech therapy. This can help to improve your speech and communication skills. It also teaches you and your family members to cope with problems of being unable to communicate.  Cognitive therapy. This therapy can help with problems caused by lack of memory, attention, or concentration.  Psychological or psychiatric therapy. This can help you cope with problems of  frustration and emotional problems that may develop after a stroke.  Document Released: 04/04/2007 Document Revised: 07/30/2013 Document Reviewed: 08/17/2012 [facility] Patient Information 2015 [place], [place]. This information is not intended to replace advice given to you by your health care provider. Make sure you discuss any questions you have with your health care provider.

## 2014-12-01 NOTE — Progress Notes (Signed)
Patient is medically stable for D/C to [facility] today. Per [facility] admissions coordinator at [facility] patient is going to room 86-B. RN will call report and arrange EMS for transport. Clinical Education officer, museum (CSW) prepared D/C packet and faxed D/C Summary to [facility]. Patient is aware of above. CSW contacted patient's sister [name] and made her aware of above. Please reconsult if future social work needs arise. CSW signing off.   Blima Rich, [place] [phone]

## 2014-12-01 NOTE — Plan of Care (Signed)
Individualization of Care Pt prefers to be called Zachary Leonard Hx of prostate cancer and HTN.  Admitted for CVA.  Problem: Discharge/Transitional Outcomes Goal: Other Discharge Outcomes/Goals Outcome: Progressing Plan of care progress to goal:  Pain:  Patient without pain this shift.  Numeric and PAINAD scale used. Hemodynamically:  Patient afebrile and VSS this shift.  BP 167/77 mmHg  Pulse 58  Temp(Src) 98.6 F (37 C) (Oral)  Resp 18  Ht 5\' 8"  (1.727 m)  Wt 151 lb 14.4 oz (68.901 kg)  BMI 23.10 kg/m2  SpO2 00% Complications:  Patient rating 16 on NIH scale this shift.  CVA affecting left side - left side flaccid but with sensation.  Neuro checks every four hours unchanged. Activity:  Patient on bedrest this shift.  Repositioned every two hours Discharge:  Plan to discharge patient to [facility] today 9/4.

## 2014-12-01 NOTE — Progress Notes (Signed)
Pt discharged to SNF. Report given to Mrs Koleen Nimrod, [place]. IV removed. Belongings packed up. Pt to leave by EMS.

## 2014-12-01 NOTE — Clinical Social Work Placement (Signed)
   CLINICAL SOCIAL WORK PLACEMENT  NOTE  Date:  12/01/2014  Patient Details  Name: Zachary Leonard MRN: [id number] Date of Birth: 1931-09-24  Clinical Social Work is seeking post-discharge placement for this patient at the [place] level of care (*CSW will initial, date and re-position this form in  chart as items are completed):  Yes   Patient/family provided with [place] Work Department's list of facilities offering this level of care within the geographic area requested by the patient (or if unable, by the patient's family).  Yes   Patient/family informed of their freedom to choose among providers that offer the needed level of care, that participate in Medicare, Medicaid or managed care program needed by the patient, have an available bed and are willing to accept the patient.  Yes   Patient/family informed of [facility]'s ownership interest in [facility] and [facility], as well as of the fact that they are under no obligation to receive care at these facilities.  PASRR submitted to EDS on 11/28/14     PASRR number received on 11/28/14     Existing PASRR number confirmed on       FL2 transmitted to all facilities in geographic area requested by pt/family on 11/29/14     FL2 transmitted to all facilities within larger geographic area on       Patient informed that his/her managed care company has contracts with or will negotiate with certain facilities, including the following:        Yes   Patient/family informed of bed offers received.  Patient chooses bed at  [facility])     Physician recommends and patient chooses bed at      Patient to be transferred to  [facility] ) on 12/01/14.  Patient to be transferred to facility by  [facility] )     Patient family notified on 12/01/14 of transfer.  Name of family member notified:   (Sister [name] was contacted via phone and made aware of D/C. )      PHYSICIAN       Additional Comment:    _______________________________________________ Loralyn Freshwater, LCSW 12/01/2014, 9:56 AM

## 2014-12-01 NOTE — Evaluation (Signed)
Physical Therapy Evaluation Patient Details Name: Zachary Leonard MRN: [id number] DOB: 08/06/1931 Today's Date: 12/01/2014   History of Present Illness  This patient is an 81 year old male who came to [facility] with left sided weakness and facial droop.  Clinical Impression  Pt has been assessed today finally and does have ability to stand and sit with some control.  Pt is motivated and excited to get to rehab.  Will continue therapy if dc is delayed to work toward more independent transfers and better sit/standing control.      Follow Up Recommendations SNF    Equipment Recommendations  None recommended by PT (await skilled disposition)    Recommendations for Other Services Rehab consult     Precautions / Restrictions Precautions Precautions: Fall Restrictions Weight Bearing Restrictions: No      Mobility  Bed Mobility Overal bed mobility: Needs Assistance Bed Mobility: Supine to Sit;Sit to Supine     Supine to sit: Max assist;Mod assist;HOB elevated Sit to supine: Mod assist;Max assist   General bed mobility comments: cued sequence and pt assists with his R side  Transfers Overall transfer level: Needs assistance Equipment used: 1 person hand held assist Transfers: Sit to/from Stand Sit to Stand: Max assist;From elevated surface (needs help with LUE in sling or a second person)         General transfer comment: stands only and cannot step  Ambulation/Gait             General Gait Details: unable to step due to L side weakness  Stairs            Wheelchair Mobility    Modified Rankin (Stroke Patients Only) Modified Rankin (Stroke Patients Only) Pre-Morbid Rankin Score: No significant disability Modified Rankin: Severe disability     Balance Overall balance assessment: Needs assistance Sitting-balance support: Feet supported;Bilateral upper extremity supported Sitting balance-Leahy Scale: Poor   Postural control: Left lateral lean (pusher  syndrome) Standing balance support: Bilateral upper extremity supported Standing balance-Leahy Scale: Poor                               Pertinent Vitals/Pain Pain Assessment: No/denies pain    Home Living Family/patient expects to be discharged to:: Skilled nursing facility Living Arrangements: Alone                    Prior Function Level of Independence: Independent         Comments: Pt states he did not use any AD     Hand Dominance   Dominant Hand: Right    Extremity/Trunk Assessment   Upper Extremity Assessment: LUE deficits/detail       LUE Deficits / Details: has activation of L shoulder blade but no active arm movement   Lower Extremity Assessment: LLE deficits/detail   LLE Deficits / Details: LLE flaccid, no movement on command  Cervical / Trunk Assessment: Normal  Communication   Communication: Expressive difficulties;Other (comment) (Slurred speech)  Cognition Arousal/Alertness: Awake/alert Behavior During Therapy: Flat affect Overall Cognitive Status: No family/caregiver present to determine baseline cognitive functioning       Memory: Decreased recall of precautions;Decreased short-term memory              General Comments General comments (skin integrity, edema, etc.): 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  Returned to bed due to impending dc to SNF  Exercises Other Exercises Other Exercises: Pt could be tested for strength but has L side neglect and R side a struggle to focus for movement for him      Assessment/Plan    PT Assessment Patient needs continued PT services  PT Diagnosis Hemiplegia non-dominant side   PT Problem List Decreased strength;Decreased range of motion;Decreased activity tolerance;Decreased balance;Decreased mobility;Decreased coordination;Decreased cognition;Decreased knowledge of use of DME;Decreased safety awareness;Decreased  knowledge of precautions;Obesity  PT Treatment Interventions DME instruction;Gait training;Functional mobility training;Therapeutic activities;Therapeutic exercise;Balance training;Neuromuscular re-education;Cognitive remediation;Patient/family education   PT Goals (Current goals can be found in the Care Plan section) Acute Rehab PT Goals Patient Stated Goal: to go to SNF PT Goal Formulation: With patient Time For Goal Achievement: 12/15/14 Potential to Achieve Goals: Good    Frequency 7X/week   Barriers to discharge Inaccessible home environment;Decreased caregiver support pt is too dependent to go home even with caregiver    Co-evaluation               End of Session Equipment Utilized During Treatment: Gait belt Activity Tolerance: Patient tolerated treatment well;Patient limited by fatigue Patient left: in bed;with call bell/phone within reach;with bed alarm set Nurse Communication: Mobility status         Time: 8115-7262 PT Time Calculation (min) (ACUTE ONLY): 33 min   Charges:   PT Evaluation $Initial PT Evaluation Tier I: 1 Procedure PT Treatments $Therapeutic Activity: 8-22 mins   PT G Codes:        Ramond Dial 12/20/14, 11:07 AM   Mee Hives, PT MS Acute Rehab Dept. Number: [facility] [phone] and [place] [phone]

## 2015-01-18 ENCOUNTER — Other Ambulatory Visit: Payer: Self-pay

## 2015-01-18 ENCOUNTER — Emergency Department: Payer: Non-veteran care

## 2015-01-18 ENCOUNTER — Emergency Department
Admission: EM | Admit: 2015-01-18 | Discharge: 2015-01-18 | Disposition: A | Discharge status: Home / Self Care | Payer: Non-veteran care | Attending: Emergency Medicine | Admitting: Emergency Medicine

## 2015-01-18 DIAGNOSIS — Z7902 Long term (current) use of antithrombotics/antiplatelets: Secondary | ICD-10-CM | POA: Diagnosis not present

## 2015-01-18 DIAGNOSIS — E860 Dehydration: Secondary | ICD-10-CM | POA: Insufficient documentation

## 2015-01-18 DIAGNOSIS — I10 Essential (primary) hypertension: Secondary | ICD-10-CM | POA: Insufficient documentation

## 2015-01-18 DIAGNOSIS — Z79899 Other long term (current) drug therapy: Secondary | ICD-10-CM | POA: Insufficient documentation

## 2015-01-18 DIAGNOSIS — R0602 Shortness of breath: Secondary | ICD-10-CM | POA: Diagnosis present

## 2015-01-18 DIAGNOSIS — D72829 Elevated white blood cell count, unspecified: Secondary | ICD-10-CM

## 2015-01-18 DIAGNOSIS — Z7982 Long term (current) use of aspirin: Secondary | ICD-10-CM | POA: Insufficient documentation

## 2015-01-18 LAB — CBC WITH DIFFERENTIAL/PLATELET
BASOS PCT: 0 %
Basophils Absolute: 0 10*3/uL (ref 0–0.1)
EOS ABS: 0.1 10*3/uL (ref 0–0.7)
Eosinophils Relative: 0 %
HEMATOCRIT: 38.9 % — AB (ref 40.0–52.0)
HEMOGLOBIN: 12.7 g/dL — AB (ref 13.0–18.0)
Lymphocytes Relative: 7 %
Lymphs Abs: 1.4 10*3/uL (ref 1.0–3.6)
MCH: 30.5 pg (ref 26.0–34.0)
MCHC: 32.6 g/dL (ref 32.0–36.0)
MCV: 93.6 fL (ref 80.0–100.0)
MONOS PCT: 8 %
Monocytes Absolute: 1.7 10*3/uL — ABNORMAL HIGH (ref 0.2–1.0)
NEUTROS ABS: 16.9 10*3/uL — AB (ref 1.4–6.5)
NEUTROS PCT: 85 %
Platelets: 254 10*3/uL (ref 150–440)
RBC: 4.15 MIL/uL — AB (ref 4.40–5.90)
RDW: 14.1 % (ref 11.5–14.5)
WBC: 20.1 10*3/uL — AB (ref 3.8–10.6)

## 2015-01-18 LAB — BASIC METABOLIC PANEL
ANION GAP: 8 (ref 5–15)
BUN: 58 mg/dL — ABNORMAL HIGH (ref 6–20)
CHLORIDE: 109 mmol/L (ref 101–111)
CO2: 28 mmol/L (ref 22–32)
Calcium: 8.7 mg/dL — ABNORMAL LOW (ref 8.9–10.3)
Creatinine, Ser: 1.16 mg/dL (ref 0.61–1.24)
GFR calc non Af Amer: 57 mL/min — ABNORMAL LOW (ref >60)
Glucose, Bld: 140 mg/dL — ABNORMAL HIGH (ref 65–99)
POTASSIUM: 3.8 mmol/L (ref 3.5–5.1)
SODIUM: 145 mmol/L (ref 135–145)

## 2015-01-18 LAB — URINALYSIS COMPLETE WITH MICROSCOPIC (ARMC ONLY)
BILIRUBIN URINE: NEGATIVE
Glucose, UA: NEGATIVE mg/dL
HGB URINE DIPSTICK: NEGATIVE
KETONES UR: NEGATIVE mg/dL
LEUKOCYTES UA: NEGATIVE
NITRITE: NEGATIVE
PH: 5 (ref 5.0–8.0)
PROTEIN: 30 mg/dL — AB
SPECIFIC GRAVITY, URINE: 1.017 (ref 1.005–1.030)
Squamous Epithelial / LPF: NONE SEEN

## 2015-01-18 MED ORDER — SODIUM CHLORIDE 0.9 % IV BOLUS (SEPSIS)
1000.0000 mL | Freq: Once | INTRAVENOUS | Status: AC
  Administered 2015-01-18: 1000 mL via INTRAVENOUS

## 2015-01-18 MED ORDER — SODIUM CHLORIDE 0.9 % IV BOLUS (SEPSIS)
500.0000 mL | Freq: Once | INTRAVENOUS | Status: AC
  Administered 2015-01-18: 500 mL via INTRAVENOUS

## 2015-01-18 MED ORDER — SODIUM CHLORIDE 0.9 % IV SOLN
Freq: Once | INTRAVENOUS | Status: AC
  Administered 2015-01-18: 12:00:00 via INTRAVENOUS

## 2015-01-18 NOTE — ED Notes (Signed)
Spoke with Lorriane Shire at [facility]; updated on patient's status, and plan for d/c. Caregiver verbalized understanding and requested EMS transport back to facility.

## 2015-01-18 NOTE — ED Provider Notes (Addendum)
[facility] Emergency Department Provider Note  ____________________________________________   I have reviewed the triage vital signs and the nursing notes.   HISTORY  Chief Complaint Shortness of Breath    HPI Zachary Leonard is a 81 y.o. male whose history is very limited. Patient states that he has no complaints. He states "sometimes" he is short of breath. The patient apparently was sent to another facility last week and where it was noted that he had elevated white count. Those records are not available to care anywhere or epic. I did call the facility, and they state that he was sent in only for dehydration. They did note that he had had a high white count but there is no obvious source. Patient has not been febrile, there is been no report of coughing. He himself states he has no complaints. Is unclear how good a history and he is. He does appear alert and oriented to name place and date. He states he'll prefer to go back home. It is the case that he was given subdermal IV fluid at the nursing home as he could not get an IV apparently. There is been no evidence of vomiting according to staff there but again the person to whom I spoke a very limited knowledge of his case.    Past Medical History  Diagnosis Date  . Cancer     prostate, treated with XRT  . HTN (hypertension)     Patient Active Problem List   Diagnosis Date Noted  . Cerebral infarction due to unspecified mechanism   . CVA (cerebral infarction) 11/27/2014  . Benign essential HTN 11/27/2014  . Elevated troponin 11/27/2014  . Rhabdomyolysis 11/27/2014  . AKI (acute kidney injury) ([place]) 11/27/2014    Past Surgical History  Procedure Laterality Date  . No past surgeries      Current Outpatient Rx  Name  Route  Sig  Dispense  Refill  . aspirin EC 81 MG tablet   Oral   Take 81 mg by mouth daily.         Marland Kitchen atorvastatin (LIPITOR) 20 MG tablet   Oral   Take 1 tablet (20 mg total) by mouth  daily at 6 PM.   30 tablet   0   . clopidogrel (PLAVIX) 75 MG tablet   Oral   Take 1 tablet (75 mg total) by mouth daily.   30 tablet   0   . lisinopril-hydrochlorothiazide (PRINZIDE,ZESTORETIC) 10-12.5 MG per tablet   Oral   Take 1 tablet by mouth daily.         . Multiple Vitamin (MULTIVITAMIN WITH MINERALS) TABS tablet   Oral   Take 1 tablet by mouth daily.           Allergies Review of patient's allergies indicates no known allergies.  Family History  Problem Relation Age of Onset  . Hypertension      Social History Social History  Substance Use Topics  . Smoking status: Never Smoker   . Smokeless tobacco: Not on file  . Alcohol Use: No     Comment: quit 10-12 years ago, prior drank 1 glass of wine daily    Review of SystemsThe limited history see above Constitutional: No fever/chills Eyes: No visual changes. ENT: No sore throat. No stiff neck no neck pain Cardiovascular: Denies chest pain. Respiratory: Denies shortness of breath. Gastrointestinal:   no vomiting.  No diarrhea.  No constipation. Genitourinary: Negative for dysuria. Musculoskeletal: Negative lower extremity swelling Skin:  Negative for rash. Neurological: Negative for headaches, focal weakness or numbness. 10-point ROS otherwise negative.  ____________________________________________   PHYSICAL EXAM:  VITAL SIGNS: ED Triage Vitals  Enc Vitals Group     BP 01/18/15 1158 129/80 mmHg     Pulse Rate 01/18/15 1158 73     Resp 01/18/15 1158 22     Temp 01/18/15 1158 97.4 F (36.3 C)     Temp Source 01/18/15 1158 Oral     SpO2 01/18/15 1151 98 %     Weight 01/18/15 1158 142 lb (64.411 kg)     Height 01/18/15 1158 5\' 8"  (1.727 m)     Head Cir --      Peak Flow --      Pain Score 01/18/15 1158 0     Pain Loc --      Pain Edu? --      Excl. in [place]? --     Constitutional: Alert and oriented. Well appearing and in no acute distress. Eyes: Conjunctivae are normal. PERRL. EOMI. Head:  Atraumatic. Nose: No congestion/rhinnorhea. Mouth/Throat: Mucous membranes areslightly dry.  Oropharynx non-erythematous. Neck: No stridor.   Nontender with no meningismus Cardiovascular: Normal rate, regular rhythm. Grossly normal heart sounds.  Good peripheral circulation. Respiratory: Normal respiratory effort.  No retractions. Lungs CTAB. Gastrointestinal: Soft and nontender. No distention. No guarding no rebound Back:  There is no focal tenderness or step off there is no midline tenderness there are no lesions noted. there is no CVA tenderness GU: Normal male external genitalia  Musculoskeletal: No lower extremity tenderness. No joint effusions, no DVT signs strong distal pulses no edema Neurologic: Baseline left-sided weakness noted  Skin:  Skin is warm, dry and intact. No rash noted. Psychiatric: Mood and affect are normal. Speech and behavior are normal.  ____________________________________________   LABS (all labs ordered are listed, but only abnormal results are displayed)  Labs Reviewed  CBC WITH DIFFERENTIAL/PLATELET - Abnormal; Notable for the following:    WBC 20.1 (*)    RBC 4.15 (*)    Hemoglobin 12.7 (*)    HCT 38.9 (*)    Neutro Abs 16.9 (*)    Monocytes Absolute 1.7 (*)    All other components within normal limits  BASIC METABOLIC PANEL - Abnormal; Notable for the following:    Glucose, Bld 140 (*)    BUN 58 (*)    Calcium 8.7 (*)    GFR calc non Af Amer 57 (*)    All other components within normal limits  URINALYSIS COMPLETEWITH MICROSCOPIC ([facility] ONLY) - Abnormal; Notable for the following:    Color, Urine YELLOW (*)    APPearance CLEAR (*)    Protein, ur 30 (*)    Bacteria, UA FEW (*)    All other components within normal limits   ____________________________________________  EKG  I personally interpreted EKG, sinus rhythm rate 83 bpm no acute ST elevation or depression, LAD noted.  ____________________________________________  RADIOLOGY  Have  reviewed x-ray  ____________________________________________   PROCEDURES  Procedure(s) performed: None  Critical Care performed: None  ____________________________________________   INITIAL IMPRESSION / ASSESSMENT AND PLAN / ED COURSE  Pertinent labs & imaging results that were available during my care of the patient were reviewed by me and considered in my medical decision making (see chart for details).  The patient is in no acute distress and appears largely at his baseline. He does appear mildly dehydrated and his blood work does show this although his creatinine is reassuring  his BUN is elevated we will give him IV fluid. He had no IV access upon arrival I did place an EJ in the right neck, which she tolerated well. His white count is noted but there is no obvious source of infection, serial abdominal exams are benign, he has no evidence of pneumonia or urinary tract infection at this time there is no cellulitis noted or skin breakdown. He may have had some loose stools, however there is no diarrhea here that we can send a sample on at this time. We will watch for and if we can we'll send a C. difficile sample. Patient will be hydrated here and we will discharge him home after that at his request. He would rather leave and be here as soon as possible   ----------------------------------------- 3:07 PM on 01/18/2015 -----------------------------------------  Patient has not been able to give [place] a stool sample here. His white count is elevated but he is also significantly contracted from his dehydration and we do not that is hemoglobin is 2.7 points from last checked and I suspect this is also because of a contraction in his intravascular volume which conservative also contribute to the elevated white count. Patient has no complaints and is feeling "just fine". We'll discharge him home with return precautions and again patient was noted to have a high white count last  week. ____________________________________________   FINAL CLINICAL IMPRESSION(S) / ED DIAGNOSES  Final diagnoses:  None     Schuyler Amor, MD 01/18/15 [place], MD 01/18/15 [phone]

## 2015-01-18 NOTE — ED Notes (Signed)
Pt here from [facility] via [facility] with "elevated WBC", EMS reports facility did not do any lab work due to pt being dehydrated, was given 1L of NS subcutaneously via  20g IV in LLQ of abdomen (still in place upon arrival). Pt reports some difficulty breathing but denies any other symptoms at present.

## 2015-01-18 NOTE — ED Notes (Signed)
Lorriane Shire (caregiver) from facility called, requesting peripheral IV. Spoke with MD and given verbal permission to place [place] IV in patient. [place] used by this RN and 20g placed in right upper arm.

## 2015-01-18 NOTE — ED Notes (Signed)
EMS at bedside to transport pt.

## 2015-01-18 NOTE — Discharge Instructions (Signed)
Return to the emergency room for new or worrisome symptoms. the cause of the elevated white count is not clear, it could be reactive. There is no clear evidence of infection. We do suggest sending the stool for stool cultures and Clostridium difficile from the facility if a stool sample was obtained. Return to the emergency room for fever or chills signs of dehydration or any new or worsening symptoms.

## 2015-02-05 ENCOUNTER — Inpatient Hospital Stay (HOSPITAL_COMMUNITY)
Admission: EM | Admit: 2015-02-05 | Discharge: 2015-02-13 | DRG: 871 | Disposition: A | Discharge status: Skilled Nursing Facility | Payer: Medicare Other | Attending: Internal Medicine | Admitting: Internal Medicine

## 2015-02-05 ENCOUNTER — Inpatient Hospital Stay (HOSPITAL_COMMUNITY): Payer: Medicare Other

## 2015-02-05 ENCOUNTER — Inpatient Hospital Stay (HOSPITAL_COMMUNITY): Admit: 2015-02-05 | Payer: Medicare Other

## 2015-02-05 ENCOUNTER — Emergency Department (HOSPITAL_COMMUNITY): Payer: Medicare Other

## 2015-02-05 ENCOUNTER — Encounter (HOSPITAL_COMMUNITY): Payer: Self-pay | Admitting: Emergency Medicine

## 2015-02-05 DIAGNOSIS — Z79899 Other long term (current) drug therapy: Secondary | ICD-10-CM

## 2015-02-05 DIAGNOSIS — I129 Hypertensive chronic kidney disease with stage 1 through stage 4 chronic kidney disease, or unspecified chronic kidney disease: Secondary | ICD-10-CM | POA: Diagnosis present

## 2015-02-05 DIAGNOSIS — I480 Paroxysmal atrial fibrillation: Secondary | ICD-10-CM | POA: Insufficient documentation

## 2015-02-05 DIAGNOSIS — I82402 Acute embolism and thrombosis of unspecified deep veins of left lower extremity: Secondary | ICD-10-CM | POA: Diagnosis present

## 2015-02-05 DIAGNOSIS — Z8673 Personal history of transient ischemic attack (TIA), and cerebral infarction without residual deficits: Secondary | ICD-10-CM | POA: Diagnosis not present

## 2015-02-05 DIAGNOSIS — I2510 Atherosclerotic heart disease of native coronary artery without angina pectoris: Secondary | ICD-10-CM | POA: Diagnosis present

## 2015-02-05 DIAGNOSIS — Z923 Personal history of irradiation: Secondary | ICD-10-CM

## 2015-02-05 DIAGNOSIS — G8194 Hemiplegia, unspecified affecting left nondominant side: Secondary | ICD-10-CM | POA: Diagnosis present

## 2015-02-05 DIAGNOSIS — E876 Hypokalemia: Secondary | ICD-10-CM | POA: Diagnosis present

## 2015-02-05 DIAGNOSIS — E860 Dehydration: Secondary | ICD-10-CM | POA: Diagnosis present

## 2015-02-05 DIAGNOSIS — I248 Other forms of acute ischemic heart disease: Secondary | ICD-10-CM | POA: Diagnosis present

## 2015-02-05 DIAGNOSIS — M7989 Other specified soft tissue disorders: Secondary | ICD-10-CM | POA: Diagnosis not present

## 2015-02-05 DIAGNOSIS — I252 Old myocardial infarction: Secondary | ICD-10-CM | POA: Diagnosis not present

## 2015-02-05 DIAGNOSIS — N179 Acute kidney failure, unspecified: Secondary | ICD-10-CM | POA: Diagnosis not present

## 2015-02-05 DIAGNOSIS — E872 Acidosis, unspecified: Secondary | ICD-10-CM

## 2015-02-05 DIAGNOSIS — R1311 Dysphagia, oral phase: Secondary | ICD-10-CM | POA: Diagnosis present

## 2015-02-05 DIAGNOSIS — A419 Sepsis, unspecified organism: Principal | ICD-10-CM

## 2015-02-05 DIAGNOSIS — Z8546 Personal history of malignant neoplasm of prostate: Secondary | ICD-10-CM

## 2015-02-05 DIAGNOSIS — J189 Pneumonia, unspecified organism: Secondary | ICD-10-CM | POA: Diagnosis present

## 2015-02-05 DIAGNOSIS — I824Y2 Acute embolism and thrombosis of unspecified deep veins of left proximal lower extremity: Secondary | ICD-10-CM | POA: Diagnosis not present

## 2015-02-05 DIAGNOSIS — Z515 Encounter for palliative care: Secondary | ICD-10-CM

## 2015-02-05 DIAGNOSIS — D649 Anemia, unspecified: Secondary | ICD-10-CM | POA: Diagnosis present

## 2015-02-05 DIAGNOSIS — E870 Hyperosmolality and hypernatremia: Secondary | ICD-10-CM | POA: Diagnosis present

## 2015-02-05 DIAGNOSIS — R0602 Shortness of breath: Secondary | ICD-10-CM | POA: Diagnosis not present

## 2015-02-05 DIAGNOSIS — R34 Anuria and oliguria: Secondary | ICD-10-CM | POA: Diagnosis present

## 2015-02-05 DIAGNOSIS — Z8249 Family history of ischemic heart disease and other diseases of the circulatory system: Secondary | ICD-10-CM | POA: Diagnosis not present

## 2015-02-05 DIAGNOSIS — I69311 Memory deficit following cerebral infarction: Secondary | ICD-10-CM

## 2015-02-05 DIAGNOSIS — E875 Hyperkalemia: Secondary | ICD-10-CM | POA: Diagnosis present

## 2015-02-05 DIAGNOSIS — Z7902 Long term (current) use of antithrombotics/antiplatelets: Secondary | ICD-10-CM

## 2015-02-05 DIAGNOSIS — R680 Hypothermia, not associated with low environmental temperature: Secondary | ICD-10-CM | POA: Diagnosis present

## 2015-02-05 DIAGNOSIS — R1310 Dysphagia, unspecified: Secondary | ICD-10-CM | POA: Insufficient documentation

## 2015-02-05 DIAGNOSIS — Y95 Nosocomial condition: Secondary | ICD-10-CM | POA: Diagnosis present

## 2015-02-05 DIAGNOSIS — J80 Acute respiratory distress syndrome: Secondary | ICD-10-CM | POA: Diagnosis not present

## 2015-02-05 DIAGNOSIS — E861 Hypovolemia: Secondary | ICD-10-CM | POA: Diagnosis present

## 2015-02-05 DIAGNOSIS — R6521 Severe sepsis with septic shock: Secondary | ICD-10-CM

## 2015-02-05 DIAGNOSIS — R740 Nonspecific elevation of levels of transaminase and lactic acid dehydrogenase [LDH]: Secondary | ICD-10-CM | POA: Diagnosis present

## 2015-02-05 DIAGNOSIS — R778 Other specified abnormalities of plasma proteins: Secondary | ICD-10-CM | POA: Diagnosis present

## 2015-02-05 DIAGNOSIS — I824Y9 Acute embolism and thrombosis of unspecified deep veins of unspecified proximal lower extremity: Secondary | ICD-10-CM | POA: Insufficient documentation

## 2015-02-05 DIAGNOSIS — R7989 Other specified abnormal findings of blood chemistry: Secondary | ICD-10-CM | POA: Diagnosis not present

## 2015-02-05 DIAGNOSIS — N170 Acute kidney failure with tubular necrosis: Secondary | ICD-10-CM | POA: Diagnosis present

## 2015-02-05 DIAGNOSIS — Z7982 Long term (current) use of aspirin: Secondary | ICD-10-CM

## 2015-02-05 DIAGNOSIS — N182 Chronic kidney disease, stage 2 (mild): Secondary | ICD-10-CM | POA: Diagnosis present

## 2015-02-05 DIAGNOSIS — R945 Abnormal results of liver function studies: Secondary | ICD-10-CM

## 2015-02-05 LAB — URINALYSIS, ROUTINE W REFLEX MICROSCOPIC
GLUCOSE, UA: 100 mg/dL — AB
Ketones, ur: 15 mg/dL — AB
Nitrite: POSITIVE — AB
PH: 5 (ref 5.0–8.0)
SPECIFIC GRAVITY, URINE: 1.03 (ref 1.005–1.030)
Urobilinogen, UA: 1 mg/dL (ref 0.0–1.0)

## 2015-02-05 LAB — CBC WITH DIFFERENTIAL/PLATELET
BASOS PCT: 0 %
Basophils Absolute: 0 10*3/uL (ref 0.0–0.1)
EOS PCT: 0 %
Eosinophils Absolute: 0 10*3/uL (ref 0.0–0.7)
HEMATOCRIT: 37.7 % — AB (ref 39.0–52.0)
HEMOGLOBIN: 12.1 g/dL — AB (ref 13.0–17.0)
LYMPHS PCT: 12 %
Lymphs Abs: 1.4 10*3/uL (ref 0.7–4.0)
MCH: 30.8 pg (ref 26.0–34.0)
MCHC: 32.1 g/dL (ref 30.0–36.0)
MCV: 95.9 fL (ref 78.0–100.0)
Monocytes Absolute: 1.6 10*3/uL — ABNORMAL HIGH (ref 0.1–1.0)
Monocytes Relative: 14 %
NEUTROS ABS: 8.4 10*3/uL — AB (ref 1.7–7.7)
NEUTROS PCT: 74 %
Platelets: 229 10*3/uL (ref 150–400)
RBC: 3.93 MIL/uL — ABNORMAL LOW (ref 4.22–5.81)
RDW: 14.7 % (ref 11.5–15.5)
WBC: 11.4 10*3/uL — ABNORMAL HIGH (ref 4.0–10.5)

## 2015-02-05 LAB — COMPREHENSIVE METABOLIC PANEL
ALBUMIN: 2.2 g/dL — AB (ref 3.5–5.0)
ALT: 66 U/L — AB (ref 17–63)
ANION GAP: 20 — AB (ref 5–15)
AST: 136 U/L — ABNORMAL HIGH (ref 15–41)
Alkaline Phosphatase: 111 U/L (ref 38–126)
BUN: 21 mg/dL — ABNORMAL HIGH (ref 6–20)
CHLORIDE: 105 mmol/L (ref 101–111)
CO2: 19 mmol/L — AB (ref 22–32)
Calcium: 8.7 mg/dL — ABNORMAL LOW (ref 8.9–10.3)
Creatinine, Ser: 2.29 mg/dL — ABNORMAL HIGH (ref 0.61–1.24)
GFR calc non Af Amer: 25 mL/min — ABNORMAL LOW (ref >60)
GFR, EST AFRICAN AMERICAN: 29 mL/min — AB (ref >60)
GLUCOSE: 142 mg/dL — AB (ref 65–99)
Potassium: 4.2 mmol/L (ref 3.5–5.1)
SODIUM: 144 mmol/L (ref 135–145)
Total Bilirubin: 1.4 mg/dL — ABNORMAL HIGH (ref 0.3–1.2)
Total Protein: 5.8 g/dL — ABNORMAL LOW (ref 6.5–8.1)

## 2015-02-05 LAB — I-STAT CHEM 8, ED
BUN: 28 mg/dL — ABNORMAL HIGH (ref 6–20)
CREATININE: 2.1 mg/dL — AB (ref 0.61–1.24)
Calcium, Ion: 0.94 mmol/L — ABNORMAL LOW (ref 1.13–1.30)
Chloride: 108 mmol/L (ref 101–111)
Glucose, Bld: 136 mg/dL — ABNORMAL HIGH (ref 65–99)
HEMATOCRIT: 43 % (ref 39.0–52.0)
HEMOGLOBIN: 14.6 g/dL (ref 13.0–17.0)
Potassium: 4.1 mmol/L (ref 3.5–5.1)
Sodium: 142 mmol/L (ref 135–145)
TCO2: 19 mmol/L (ref 0–100)

## 2015-02-05 LAB — I-STAT ARTERIAL BLOOD GAS, ED
ACID-BASE DEFICIT: 19 mmol/L — AB (ref 0.0–2.0)
BICARBONATE: 7.1 meq/L — AB (ref 20.0–24.0)
O2 Saturation: 72 %
PH ART: 7.236 — AB (ref 7.350–7.450)
TCO2: 8 mmol/L (ref 0–100)
pCO2 arterial: 16.7 mmHg — CL (ref 35.0–45.0)
pO2, Arterial: 43 mmHg — ABNORMAL LOW (ref 80.0–100.0)

## 2015-02-05 LAB — TROPONIN I
TROPONIN I: 1.27 ng/mL — AB (ref <0.031)
TROPONIN I: 1.36 ng/mL — AB (ref <0.031)
Troponin I: 1.67 ng/mL (ref <0.031)

## 2015-02-05 LAB — LACTIC ACID, PLASMA: LACTIC ACID, VENOUS: 5.2 mmol/L — AB (ref 0.5–2.0)

## 2015-02-05 LAB — I-STAT TROPONIN, ED: Troponin i, poc: 1.09 ng/mL (ref 0.00–0.08)

## 2015-02-05 LAB — PROCALCITONIN: Procalcitonin: 1.59 ng/mL

## 2015-02-05 LAB — MRSA PCR SCREENING: MRSA by PCR: NEGATIVE

## 2015-02-05 LAB — URINE MICROSCOPIC-ADD ON

## 2015-02-05 LAB — I-STAT CG4 LACTIC ACID, ED
LACTIC ACID, VENOUS: 8.76 mmol/L — AB (ref 0.5–2.0)
Lactic Acid, Venous: 7.21 mmol/L (ref 0.5–2.0)

## 2015-02-05 LAB — HEPARIN LEVEL (UNFRACTIONATED): Heparin Unfractionated: 0.94 IU/mL — ABNORMAL HIGH (ref 0.30–0.70)

## 2015-02-05 LAB — TSH: TSH: 2.655 u[IU]/mL (ref 0.350–4.500)

## 2015-02-05 LAB — MAGNESIUM: MAGNESIUM: 1.8 mg/dL (ref 1.7–2.4)

## 2015-02-05 LAB — GLUCOSE, CAPILLARY: GLUCOSE-CAPILLARY: 88 mg/dL (ref 65–99)

## 2015-02-05 LAB — CK: Total CK: 47 U/L — ABNORMAL LOW (ref 49–397)

## 2015-02-05 LAB — PHOSPHORUS: Phosphorus: 5.9 mg/dL — ABNORMAL HIGH (ref 2.5–4.6)

## 2015-02-05 LAB — BRAIN NATRIURETIC PEPTIDE: B NATRIURETIC PEPTIDE 5: 401.7 pg/mL — AB (ref 0.0–100.0)

## 2015-02-05 MED ORDER — ACETAMINOPHEN 325 MG RE SUPP: 325.0000 mg | Freq: Four times a day (QID) | RECTAL | Status: DC | PRN

## 2015-02-05 MED ORDER — PIPERACILLIN-TAZOBACTAM 3.375 G IVPB 30 MIN
3.3750 g | Freq: Once | INTRAVENOUS | Status: AC
  Administered 2015-02-05: 3.375 g via INTRAVENOUS
  Filled 2015-02-05: qty 50

## 2015-02-05 MED ORDER — VANCOMYCIN HCL 10 G IV SOLR
1250.0000 mg | Freq: Once | INTRAVENOUS | Status: AC
  Administered 2015-02-05: 1250 mg via INTRAVENOUS
  Filled 2015-02-05: qty 1250

## 2015-02-05 MED ORDER — BISACODYL 10 MG RE SUPP: 10.0000 mg | Freq: Every day | RECTAL | Status: DC | PRN

## 2015-02-05 MED ORDER — THIAMINE HCL 100 MG/ML IJ SOLN
100.0000 mg | Freq: Once | INTRAMUSCULAR | Status: AC
  Administered 2015-02-05: 100 mg via INTRAVENOUS
  Filled 2015-02-05: qty 2

## 2015-02-05 MED ORDER — PIPERACILLIN-TAZOBACTAM 3.375 G IVPB
3.3750 g | Freq: Three times a day (TID) | INTRAVENOUS | Status: DC
  Administered 2015-02-05 – 2015-02-06 (×3): 3.375 g via INTRAVENOUS
  Filled 2015-02-05 (×5): qty 50

## 2015-02-05 MED ORDER — VANCOMYCIN HCL IN DEXTROSE 1-5 GM/200ML-% IV SOLN: 1000.0000 mg | Freq: Once | INTRAVENOUS | Status: DC

## 2015-02-05 MED ORDER — ASPIRIN EC 81 MG PO TBEC: 81.0000 mg | DELAYED_RELEASE_TABLET | Freq: Every day | ORAL | Status: DC

## 2015-02-05 MED ORDER — VANCOMYCIN HCL IN DEXTROSE 750-5 MG/150ML-% IV SOLN
750.0000 mg | INTRAVENOUS | Status: DC
  Administered 2015-02-06: 750 mg via INTRAVENOUS
  Filled 2015-02-05: qty 150

## 2015-02-05 MED ORDER — VANCOMYCIN HCL IN DEXTROSE 750-5 MG/150ML-% IV SOLN: 750.0000 mg | INTRAVENOUS | Status: DC

## 2015-02-05 MED ORDER — SODIUM CHLORIDE 0.9 % IV BOLUS (SEPSIS)
1000.0000 mL | Freq: Once | INTRAVENOUS | Status: AC
  Administered 2015-02-05: 1000 mL via INTRAVENOUS

## 2015-02-05 MED ORDER — HEPARIN BOLUS VIA INFUSION
3000.0000 [IU] | Freq: Once | INTRAVENOUS | Status: AC
  Administered 2015-02-05: 3000 [IU] via INTRAVENOUS
  Filled 2015-02-05: qty 3000

## 2015-02-05 MED ORDER — CLOPIDOGREL BISULFATE 75 MG PO TABS: 75.0000 mg | ORAL_TABLET | Freq: Every day | ORAL | Status: DC

## 2015-02-05 MED ORDER — SODIUM CHLORIDE 0.9 % IV BOLUS (SEPSIS)
1000.0000 mL | INTRAVENOUS | Status: AC
  Administered 2015-02-05 (×2): 1000 mL via INTRAVENOUS

## 2015-02-05 MED ORDER — PIPERACILLIN-TAZOBACTAM 3.375 G IVPB
3.3750 g | Freq: Three times a day (TID) | INTRAVENOUS | Status: DC
  Filled 2015-02-05 (×2): qty 50

## 2015-02-05 MED ORDER — ASPIRIN 300 MG RE SUPP
300.0000 mg | Freq: Once | RECTAL | Status: AC
  Administered 2015-02-05: 300 mg via RECTAL
  Filled 2015-02-05: qty 1

## 2015-02-05 MED ORDER — ACETAMINOPHEN 500 MG PO TABS
500.0000 mg | ORAL_TABLET | Freq: Four times a day (QID) | ORAL | Status: DC | PRN
Start: 2015-02-05 — End: 2015-02-13

## 2015-02-05 MED ORDER — SODIUM CHLORIDE 0.9 % IV SOLN
INTRAVENOUS | Status: AC
  Administered 2015-02-05: 10:00:00 via INTRAVENOUS

## 2015-02-05 MED ORDER — ADULT MULTIVITAMIN W/MINERALS CH
1.0000 | ORAL_TABLET | Freq: Every day | ORAL | Status: DC
  Administered 2015-02-08 – 2015-02-13 (×3): 1 via ORAL
  Filled 2015-02-05 (×5): qty 1

## 2015-02-05 MED ORDER — HEPARIN SODIUM (PORCINE) 5000 UNIT/ML IJ SOLN: 5000.0000 [IU] | Freq: Three times a day (TID) | INTRAMUSCULAR | Status: DC

## 2015-02-05 MED ORDER — ASPIRIN 81 MG PO CHEW
324.0000 mg | CHEWABLE_TABLET | Freq: Once | ORAL | Status: AC
  Administered 2015-02-05: 324 mg via ORAL
  Filled 2015-02-05: qty 4

## 2015-02-05 MED ORDER — FOLIC ACID 5 MG/ML IJ SOLN
1.0000 mg | Freq: Once | INTRAMUSCULAR | Status: DC
  Filled 2015-02-05: qty 0.2

## 2015-02-05 MED ORDER — HEPARIN (PORCINE) IN NACL 100-0.45 UNIT/ML-% IJ SOLN
800.0000 [IU]/h | INTRAMUSCULAR | Status: DC
  Administered 2015-02-05: 950 [IU]/h via INTRAVENOUS
  Filled 2015-02-05: qty 250

## 2015-02-05 NOTE — Progress Notes (Signed)
  Echocardiogram 2D Echocardiogram has been performed.  Zachary Leonard 02/05/2015, 1:44 PM

## 2015-02-05 NOTE — Progress Notes (Signed)
ANTICOAGULATION CONSULT NOTE - Follow Up Consult  Pharmacy Consult for heparin Indication: DVT  No Known Allergies  Patient Measurements: Height: 5\' 7"  (170.2 cm) (estimated) Weight: 141 lb 15.6 oz (64.4 kg) (01/18/15) IBW/kg (Calculated) : 66.1  Vital Signs: Temp: 97.7 F (36.5 C) (11/09 1600) Temp Source: Axillary (11/09 1600) BP: 116/82 mmHg (11/09 1600) Pulse Rate: 83 (11/09 1600)  Labs:  Recent Labs  02/05/15 0506 02/05/15 0518 02/05/15 1430 02/05/15 1908  HGB 12.1* 14.6  --   --   HCT 37.7* 43.0  --   --   PLT 229  --   --   --   HEPARINUNFRC  --   --   --  0.94*  CREATININE 2.29* 2.10*  --   --   CKTOTAL 47*  --   --   --   TROPONINI  --   --  1.36*  1.27*  --     Estimated Creatinine Clearance: 25.1 mL/min (by C-G formula based on Cr of 2.1).  Assessment: 81 yo M admitted 02/05/2015 with new L LE DVT on heparin.  Patient received 3000 unit heparin bolus and then started on heparin drip at 950 units/hr. HL 0.94 (supratherapeutic), Hgb 14.6, Plts wnl. No s/sx of bleeding noted.  Goal of Therapy:  Heparin level 0.3-0.7 units/ml Monitor platelets by anticoagulation protocol: Yes   Plan:  - Decrease heparin drip to 800 units/hr - Monitor 6 hour heparin level - Monitor daily HL, CBC and s/sx of bleeding   Dimitri Ped, PharmD. Clinical Pharmacist Resident Pager: [phone]  02/05/2015,8:02 PM

## 2015-02-05 NOTE — Progress Notes (Signed)
At bedside, pt is hyperventilating, RR 42-48 times per minute. RN aware/bedside

## 2015-02-05 NOTE — Progress Notes (Signed)
CRITICAL VALUE ALERT  Critical value received: lactic acid 5.2, Troponin 1.27  Date of notification: 02/05/15  Time of notification:  1554  Critical value read back: Yes  Nurse who received alert:  Jana Half RN  MD notified (1st page): Posey Pronto, on call  Time of first page:  none  MD notified (2nd page): none  Time of second page: none  Responding MD:  Posey Pronto, internal medicine  Time MD responded:  1600

## 2015-02-05 NOTE — Progress Notes (Signed)
ANTICOAGULATION CONSULT NOTE - Initial Consult  Pharmacy Consult for heparin Indication: afib and r/o PE  No Known Allergies  Patient Measurements: Weight: 141 lb 15.6 oz (64.4 kg) (01/18/15)   Vital Signs: Temp: 96.7 F (35.9 C) (11/09 0829) Temp Source: Rectal (11/09 0829) BP: 110/80 mmHg (11/09 0915) Pulse Rate: 76 (11/09 0915)  Labs:  Recent Labs  02/05/15 0506 02/05/15 0518  HGB 12.1* 14.6  HCT 37.7* 43.0  PLT 229  --   CREATININE 2.29* 2.10*  CKTOTAL 47*  --     Estimated Creatinine Clearance: 25.1 mL/min (by C-G formula based on Cr of 2.1).   Medical History: Past Medical History  Diagnosis Date  . Cancer [facility])     prostate, treated with XRT  . HTN (hypertension)     Medications:  Prescriptions prior to admission  Medication Sig Dispense Refill Last Dose  . aspirin EC 81 MG tablet Take 81 mg by mouth daily.   02/04/2015 at Unknown time  . atorvastatin (LIPITOR) 20 MG tablet Take 1 tablet (20 mg total) by mouth daily at 6 PM. 30 tablet 0 02/04/2015 at Unknown time  . clopidogrel (PLAVIX) 75 MG tablet Take 1 tablet (75 mg total) by mouth daily. 30 tablet 0 02/04/2015 at Unknown time  . lisinopril-hydrochlorothiazide (PRINZIDE,ZESTORETIC) 10-12.5 MG per tablet Take 1 tablet by mouth daily.   02/04/2015 at Unknown time  . Multiple Vitamin (MULTIVITAMIN WITH MINERALS) TABS tablet Take 1 tablet by mouth daily.   02/04/2015 at Unknown time    Assessment: 81 yo M to start heparin per pharmacy for afib and r/o PE.  Pt with afib and RVR on cardizem drip.  Also acute dyspnea at at high risk for PE given previous stroke and basically bedbound.  CBC WNL.  Wt 64.4 on 01/18/15. Creat 2.1   Goal of Therapy:  Heparin level 0.3-0.7 units/ml Monitor platelets by anticoagulation protocol: Yes   Plan:  Give 3000 units bolus x 1 Start heparin infusion at 950 units/hr Check anti-Xa level in 8 hours and daily while on heparin Continue to monitor H&H and platelets  Eudelia Bunch, Pharm.D. [phone] 02/05/2015 10:40 AM

## 2015-02-05 NOTE — Progress Notes (Signed)
abg collected  

## 2015-02-05 NOTE — H&P (Signed)
Date: 02/05/2015               Patient Name:  Zachary Leonard MRN: [id number]  DOB: February 11, 1932 Age / Sex: 81 y.o., male   PCP: Provider Not In System           Medical Service: Internal Medicine Teaching Service         Attending Physician: Dr. Aldine Contes, MD    First Contact: Dr. Posey Pronto, MD Pager: [phone]  Second Contact: Dr. Genene Churn, MD Pager: [phone]       After Hours (After 5p/  First Contact Pager: [phone]  weekends / holidays): Second Contact Pager: [phone]    Most Recent Discharge Date:  01/18/15  Chief Complaint:  Chief Complaint  Patient presents with  . Shortness of Breath       History of Present Illness:  Zachary Leonard is a 81 y.o. male who has PMH of prostate cancer s/p radiation, CVA, HTN, rhabdomyolosis, AKI, cognitive impairment, and NSTEMI.    He presents to the ED by EMS from [facility] with dyspnea only able to speak in 1-2 word sentences.  He was found to be in Afib with RVR with a HR in the 180s and given 9m cardizem with HR now in the low 100s.  He was also hypotensive with SBP of 60 which has now back up to the 90s on his 3rd liter of IVFs.  He was put on a NRB with O2 SpO2 of 100.  Pt was unable to provide much history but denied any pain, HA, N/V.  He did report a cough and having chills but unclear if this is reliable as he was disoriented.   [place] found to have an acute infarct on MRI involving the R MCA territory and placed on plavix x 3 months , ASA 849m and atorvastatin 2030m SBP on discharge was 162/60 with a HR of 50.  During that admission he was placed on a dysphagia 1 diet d/t difficulty swallowing.  Additionally, he had an elevated troponin and rhabdomyolysis with an AKI.  Neurology indicated a poor prognosis according to chart review.  Of note, palliative care consulted during last admission and patient desired to be full code after much  discussion although he has difficulty with memory and has poor insight.  [place] sister [place] also several nieces that were present in the ED able to answer some questions.  Of note, nieces state that he is non-ambulatory and uses a wheelchair.    On arrival in ED, pt was given 4L of NS with improvement in BP.  CXR revealed low volumes with interstitial crowing but without PNA.  Unable to obtain a urine specimen d/t low output.  Bladder scan revealed only about ~17-35cc.  Lactic acid was 8.6 with an ABG c/w a metabolic acidosis.  Mild leukocytosis of 11,400 and elevated SCr and transaminases.  Trop was mildly elevated at 1.09 with an EKG showing Afib with RVR at a HR of 113.  BNP mildly elevated at 401.  Echo in September  showed an EF of ~60%.  It is unclear if he received a loop recorder during his previous admission.  Rectal temp was also low at 96.7.     Meds: Current Facility-Administered Medications  Medication Dose Route Frequency Provider Last Rate Last Dose  . 0.9 %  sodium chloride infusion   Intravenous Continuous JacJones Bales  MD 150 mL/hr at 02/05/15 1002    . acetaminophen (TYLENOL) tablet 500 mg  500 mg Oral Q6H PRN Jones Bales, MD       Or  . acetaminophen (TYLENOL) suppository 325 mg  325 mg Rectal Q6H PRN Jones Bales, MD      . aspirin EC tablet 81 mg  81 mg Oral Daily Jones Bales, MD      . bisacodyl (DULCOLAX) suppository 10 mg  10 mg Rectal Daily PRN Jones Bales, MD      . clopidogrel (PLAVIX) tablet 75 mg  75 mg Oral Daily Jones Bales, MD      . heparin ADULT infusion 100 units/mL (25000 units/250 mL)  950 Units/hr Intravenous Continuous Eudelia Bunch, RPH      . heparin bolus via infusion 3,000 Units  3,000 Units Intravenous Once Eudelia Bunch, RPH      . multivitamin with minerals tablet 1 tablet  1 tablet Oral Daily Jones Bales, MD      . piperacillin-tazobactam (ZOSYN) IVPB 3.375 g  3.375 g Intravenous Q8H Eudelia Bunch,  RPH      . thiamine (B-1) injection 100 mg  100 mg Intravenous Once Jones Bales, MD      . Derrill Memo ON 02/06/2015] vancomycin (VANCOCIN) IVPB 750 mg/150 ml premix  750 mg Intravenous Q24H Eudelia Bunch, [facility]        Prescriptions prior to admission  Medication Sig Dispense Refill Last Dose  . aspirin EC 81 MG tablet Take 81 mg by mouth daily.   02/04/2015 at Unknown time  . atorvastatin (LIPITOR) 20 MG tablet Take 1 tablet (20 mg total) by mouth daily at 6 PM. 30 tablet 0 02/04/2015 at Unknown time  . clopidogrel (PLAVIX) 75 MG tablet Take 1 tablet (75 mg total) by mouth daily. 30 tablet 0 02/04/2015 at Unknown time  . lisinopril-hydrochlorothiazide (PRINZIDE,ZESTORETIC) 10-12.5 MG per tablet Take 1 tablet by mouth daily.   02/04/2015 at Unknown time  . Multiple Vitamin (MULTIVITAMIN WITH MINERALS) TABS tablet Take 1 tablet by mouth daily.   02/04/2015 at Unknown time    Allergies: Allergies as of 02/05/2015  . (No Known Allergies)    PMH: Past Medical History  Diagnosis Date  . Cancer [facility])     prostate, treated with XRT  . HTN (hypertension)     PSH: Past Surgical History  Procedure Laterality Date  . No past surgeries      FH: Family History  Problem Relation Age of Onset  . Hypertension      SH: Social History  Substance Use Topics  . Smoking status: Never Smoker   . Smokeless tobacco: Not on file  . Alcohol Use: No     Comment: quit 10-12 years ago, prior drank 1 glass of wine daily    Review of Systems: Pertinent items are noted in HPI.  Physical Exam: BP 110/80 mmHg  Pulse 76  Temp(Src) 96.7 F (35.9 C) (Rectal)  Resp 25  Wt 141 lb 15.6 oz (64.4 kg)  SpO2 100%  Physical Exam  Constitutional: He appears lethargic and dehydrated. He appears toxic.  HENT:  Head: Normocephalic and atraumatic.  Eyes: Conjunctivae are normal. No scleral icterus.  Cardiovascular: Intact distal pulses.  An irregularly irregular rhythm present. Tachycardia present.    Pulmonary/Chest: Tachypnea noted. He has decreased breath sounds.  Abdominal: Soft. Bowel sounds are normal. He exhibits no distension. There is no tenderness. There is no  rebound and no guarding.  Neurological: He appears lethargic. He is disoriented. He displays weakness and abnormal speech.  Skin: Skin is dry and intact. No rash noted.  Extremities are cool to touch.  Severe onychomycosis present, left great toenail digging into the left 2nd toe.   Psychiatric: He exhibits abnormal recent memory and abnormal remote memory.   Lab results:  Basic Metabolic Panel:  Recent Labs  02/05/15 0506 02/05/15 0518  NA 144 142  K 4.2 4.1  CL 105 108  CO2 19*  --   GLUCOSE 142* 136*  BUN 21* 28*  CREATININE 2.29* 2.10*  CALCIUM 8.7*  --     Calcium/Magnesium/Phosphorus:  Recent Labs Lab 02/05/15 0506  CALCIUM 8.7*    Liver Function Tests:  Recent Labs  02/05/15 0506  AST 136*  ALT 66*  ALKPHOS 111  BILITOT 1.4*  PROT 5.8*  ALBUMIN 2.2*   No results for input(s): LIPASE, AMYLASE in the last 72 hours. No results for input(s): AMMONIA in the last 72 hours.  CBC: Lab Results  Component Value Date   WBC 11.4* 02/05/2015   HGB 14.6 02/05/2015   HCT 43.0 02/05/2015   MCV 95.9 02/05/2015   PLT 229 02/05/2015    Lipase: No results found for: LIPASE  Lactic Acid/Procalcitonin:  Recent Labs Lab 02/05/15 0511 02/05/15 0808  LATICACIDVEN 8.76* 7.21*    Cardiac Enzymes:  Recent Labs  02/05/15 0510  TROPIPOC 1.09*   Lab Results  Component Value Date   CKTOTAL 47* 02/05/2015   TROPONINI 0.09* 11/28/2014    BNP: No results for input(s): PROBNP in the last 72 hours.  D-Dimer: No results for input(s): DDIMER in the last 72 hours.  CBG: No results for input(s): GLUCAP in the last 72 hours.  Hemoglobin A1C: No results for input(s): HGBA1C in the last 72 hours.  Lipid Panel: No results for input(s): CHOL, HDL, LDLCALC, TRIG, CHOLHDL, LDLDIRECT in the  last 72 hours.  Thyroid Function Tests: No results for input(s): TSH, T4TOTAL, FREET4, T3FREE, THYROIDAB in the last 72 hours.  Anemia Panel: No results for input(s): VITAMINB12, FOLATE, FERRITIN, TIBC, IRON, RETICCTPCT in the last 72 hours.  Coagulation: No results for input(s): LABPROT, INR in the last 72 hours.  Urine Drug Screen: Drugs of Abuse:  No results found for: LABOPIA, COCAINSCRNUR, LABBENZ, AMPHETMU, THCU, LABBARB  Alcohol Level: No results for input(s): ETH in the last 72 hours.  Urinalysis:    Component Value Date/Time   COLORURINE YELLOW* 01/18/2015 1332   APPEARANCEUR CLEAR* 01/18/2015 1332   LABSPEC 1.017 01/18/2015 1332   PHURINE 5.0 01/18/2015 1332   GLUCOSEU NEGATIVE 01/18/2015 1332   HGBUR NEGATIVE 01/18/2015 1332   BILIRUBINUR NEGATIVE 01/18/2015 1332   KETONESUR NEGATIVE 01/18/2015 1332   PROTEINUR 30* 01/18/2015 1332   NITRITE NEGATIVE 01/18/2015 1332   LEUKOCYTESUR NEGATIVE 01/18/2015 1332    Imaging results:  Dg Chest Port 1 View  02/05/2015  CLINICAL DATA:  Elevated white count.  Dyspnea EXAM: PORTABLE CHEST 1 VIEW COMPARISON:  01/18/2015 FINDINGS: Hypoventilation with interstitial crowding. There is no edema, consolidation, effusion, or pneumothorax. Borderline cardiomegaly, accentuated by technique. Stable mild aortic tortuosity. IMPRESSION: Negative low volume chest. Electronically Signed   By: Monte Fantasia M.D.   On: 02/05/2015 05:30    EKG: EKG Interpretation  Date/Time:  Wednesday February 05 2015 04:56:58 EST Ventricular Rate:  113 PR Interval:  145 QRS Duration: 78 QT Interval:  383 QTC Calculation: 525 R Axis:   -64 Text  Interpretation:  Atrial fibrillation Inferior infarct, age indeterminate Anterior infarct, old rate is faster, more irregular compared to Oct 2016 Confirmed by Regenia Skeeter  MD, SCOTT [phone]) on 02/05/2015 5:07:28 AM   Antibiotics: Antibiotics Given (last 72 hours)    None      Anti-infectives    Start      Dose/Rate Route Frequency Ordered Stop   02/06/15 0600  vancomycin (VANCOCIN) IVPB 750 mg/150 ml premix  Status:  Discontinued     750 mg 150 mL/hr over 60 Minutes Intravenous Every 24 hours 02/05/15 0527 02/05/15 0957   02/06/15 0600  vancomycin (VANCOCIN) IVPB 750 mg/150 ml premix     750 mg 150 mL/hr over 60 Minutes Intravenous Every 24 hours 02/05/15 1033     02/05/15 1400  piperacillin-tazobactam (ZOSYN) IVPB 3.375 g  Status:  Discontinued     3.375 g 12.5 mL/hr over 240 Minutes Intravenous Every 8 hours 02/05/15 0527 02/05/15 0957   02/05/15 1200  piperacillin-tazobactam (ZOSYN) IVPB 3.375 g     3.375 g 12.5 mL/hr over 240 Minutes Intravenous Every 8 hours 02/05/15 1033     02/05/15 0530  piperacillin-tazobactam (ZOSYN) IVPB 3.375 g     3.375 g 100 mL/hr over 30 Minutes Intravenous  Once 02/05/15 0517 02/05/15 0629   02/05/15 0530  vancomycin (VANCOCIN) IVPB 1000 mg/200 mL premix  Status:  Discontinued     1,000 mg 200 mL/hr over 60 Minutes Intravenous  Once 02/05/15 0517 02/05/15 0522   02/05/15 0530  vancomycin (VANCOCIN) 1,250 mg in sodium chloride 0.9 % 250 mL IVPB     1,250 mg 166.7 mL/hr over 90 Minutes Intravenous  Once 02/05/15 0522 02/05/15 0910      SIRS/Sepsis/Septic Shock criteria met:  Yes.   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  He has a mild leukocytosis of ~11,000.  Lactic acid 8.76 on admission and has come down to 7.21 with ~4L of IVF.  He was started on vanc/zosyn without a clear source of infection.  CXR with low volumes but w/o PNA.  However, he also appears very dry so this may not be accurate.  UA unobtainable currently d/t  poor output.  Blood cx drawn prior to receiving abx.  SCr and transminases elevated likely d/t sepsis/hypotension.   -admit to SDU for close observation with low threshold to transfer to ICU  -trend lactic acid -cont IVF  -bear hugger -await blood cx, UA, repeat CXR in AM  -obtain records from SNF   Acute Atrial Fibrillation with RVR CHADS2: 5 and HASBLED 5.  He has been on ASA and plavix for CVA.  TTE in September showed mild atrial dilation.  HR currently controlled after 55m cardizem.  Sepsis likely contributing.   -cont abx -goal HR<110 -check TSH, Mg, Phos -trend trops -heparin gtt   Acute dyspnea He is at high risk for PE given previous CVA and basically bedbound.  Also tachypneic, tachycardic, and hypoxic.  Also at risk for aspiration PNA.  -LE dopplers -will start on heparin gtt   History of CVA  -cont plavix & ASA -cont statin   Acute Kidney Injury  SCr now 2.29 and previously 1.16 two  weeks ago.   Lab Results  Component Value Date   CREATININE 2.10* 02/05/2015  No recent exposure to IV contrast, abx, hematuria, hematochezia, melena. -hold lisinopril/HCTZ  -strict I/Os, daily weights -avoid nephrotoxins  Elevated troponin Pt has risk factors for ACS including age, HTN, dyslipidemia, previous CVA.  However, likely d/t demand ischemia in the setting of acute illness.  -cont ASA -will trend troponin  Hypertension  -per above  Elevated transaminases  AST/ALT 136/66.   -monitor  Dyslipidemia  Lab Results  Component Value Date   LDLCALC 109* 11/28/2014  -cont statin   FEN  Fluids-175m/hr after 4 NS boluses Electrolytes-Replete as needed  Nutrition- NPO for now pending swallow eval  VTE prophylaxis  -heparin gtt for atrial fibrillation, elevated troponin, and possible PE   Disposition Disposition deferred at this time, awaiting improvement of current medical problems. Anticipated discharge in approximately 3-4 day(s).   -consult social  work   [place]   Name ROak GroveWork Mobile   Morrow,[name] Sister [phone]    TLenna Sciara  [phone]     The patient does not have a current PCP (Provider Not In System) and [facility].  Signed JJones Bales MD PGY-3, Internal Medicine Teaching Service 02/05/2015, 10:36 AM

## 2015-02-05 NOTE — ED Provider Notes (Addendum)
CSN: [id number]     Arrival date & time 02/05/15  0455 History   First MD Initiated Contact with Patient 02/05/15 0454     Chief Complaint  Patient presents with  . Shortness of Breath     (Consider location/radiation/quality/duration/timing/severity/associated sxs/prior Treatment) HPI  81 year old male presents with shortness of breath. History is very limited as the patient only speaks in 1 or 2 word sentences and only when answering questions. EMS also presents with limited history. Apparently the patient developed increased work of breathing tonight and was noticed to have A. fib with RVR. He had an EKG about 10 hours ago that showed A. fib with RVR with a heart rate of 154. Patient answers yes to if he is shortness of breath and no to chest pain but will not provide any further information. It does not appear that he has been coughing. Unclear if he has had fever. EMS noted the patient to have A. fib with RVR with a heart rate in the 180s, after 10 of Cardizem his heart rate came into the low 100s. He was also hypotensive initially with a blood pressure of 60, this has come up into the 90s. Unclear about any other history. I attempted to call [facility] but no one answered. EMS indicates that this mental status seems to be his baseline (recent large stroke).  Past Medical History  Diagnosis Date  . Cancer     prostate, treated with XRT  . HTN (hypertension)    Past Surgical History  Procedure Laterality Date  . No past surgeries     Family History  Problem Relation Age of Onset  . Hypertension     Social History  Substance Use Topics  . Smoking status: Never Smoker   . Smokeless tobacco: Not on file  . Alcohol Use: No     Comment: quit 10-12 years ago, prior drank 1 glass of wine daily    Review of Systems  Unable to perform ROS: Patient nonverbal      Allergies  Review of patient's allergies indicates no known allergies.  Home Medications   Prior  to Admission medications   Medication Sig Start Date End Date Taking? Authorizing Provider  aspirin EC 81 MG tablet Take 81 mg by mouth daily.    Historical Provider, MD  atorvastatin (LIPITOR) 20 MG tablet Take 1 tablet (20 mg total) by mouth daily at 6 PM. 12/01/14   Max Sane, MD  clopidogrel (PLAVIX) 75 MG tablet Take 1 tablet (75 mg total) by mouth daily. 12/01/14   Max Sane, MD  lisinopril-hydrochlorothiazide (PRINZIDE,ZESTORETIC) 10-12.5 MG per tablet Take 1 tablet by mouth daily.    Historical Provider, MD  Multiple Vitamin (MULTIVITAMIN WITH MINERALS) TABS tablet Take 1 tablet by mouth daily.    Historical Provider, MD   There were no vitals taken for this visit. Physical Exam  Constitutional: He appears well-developed and well-nourished.  HENT:  Head: Normocephalic and atraumatic.  Right Ear: External ear normal.  Left Ear: External ear normal.  Nose: Nose normal.  Eyes: Right eye exhibits no discharge. Left eye exhibits no discharge.  Neck: Neck supple.  Cardiovascular: Regular rhythm, normal heart sounds and intact distal pulses.  Tachycardia present.   Pulmonary/Chest: Breath sounds normal. Tachypnea noted.  Abdominal: Soft. There is no tenderness.  Musculoskeletal: He exhibits no edema.  Neurological: He is alert.  Skin: Skin is warm and dry.  Nursing note and vitals reviewed.   ED Course  Procedures (  including critical care time) Labs Review Labs Reviewed  COMPREHENSIVE METABOLIC PANEL - Abnormal; Notable for the following:    CO2 19 (*)    Glucose, Bld 142 (*)    BUN 21 (*)    Creatinine, Ser 2.29 (*)    Calcium 8.7 (*)    Total Protein 5.8 (*)    Albumin 2.2 (*)    AST 136 (*)    ALT 66 (*)    Total Bilirubin 1.4 (*)    GFR calc non Af Amer 25 (*)    GFR calc Af Amer 29 (*)    Anion gap 20 (*)    All other components within normal limits  CBC WITH DIFFERENTIAL/PLATELET - Abnormal; Notable for the following:    WBC 11.4 (*)    RBC 3.93 (*)    Hemoglobin  12.1 (*)    HCT 37.7 (*)    Neutro Abs 8.4 (*)    Monocytes Absolute 1.6 (*)    All other components within normal limits  BRAIN NATRIURETIC PEPTIDE - Abnormal; Notable for the following:    B Natriuretic Peptide 401.7 (*)    All other components within normal limits  CK - Abnormal; Notable for the following:    Total CK 47 (*)    All other components within normal limits  I-STAT TROPOININ, ED - Abnormal; Notable for the following:    Troponin i, poc 1.09 (*)    All other components within normal limits  I-STAT CG4 LACTIC ACID, ED - Abnormal; Notable for the following:    Lactic Acid, Venous 8.76 (*)    All other components within normal limits  I-STAT ARTERIAL BLOOD GAS, ED - Abnormal; Notable for the following:    pH, Arterial 7.236 (*)    pCO2 arterial 16.7 (*)    pO2, Arterial 43.0 (*)    Bicarbonate 7.1 (*)    Acid-base deficit 19.0 (*)    All other components within normal limits  I-STAT CHEM 8, ED - Abnormal; Notable for the following:    BUN 28 (*)    Creatinine, Ser 2.10 (*)    Glucose, Bld 136 (*)    Calcium, Ion 0.94 (*)    All other components within normal limits  CULTURE, BLOOD (ROUTINE X 2)  CULTURE, BLOOD (ROUTINE X 2)  URINE CULTURE  URINALYSIS, ROUTINE W REFLEX MICROSCOPIC (NOT AT [facility])  I-STAT CG4 LACTIC ACID, ED    Imaging Review Dg Chest Port 1 View  02/05/2015  CLINICAL DATA:  Elevated white count.  Dyspnea EXAM: PORTABLE CHEST 1 VIEW COMPARISON:  01/18/2015 FINDINGS: Hypoventilation with interstitial crowding. There is no edema, consolidation, effusion, or pneumothorax. Borderline cardiomegaly, accentuated by technique. Stable mild aortic tortuosity. IMPRESSION: Negative low volume chest. Electronically Signed   By: Monte Fantasia M.D.   On: 02/05/2015 05:30   I have personally reviewed and evaluated these images and lab results as part of my medical decision-making.   EKG Interpretation   Date/Time:  Wednesday February 05 2015 04:56:58  EST Ventricular Rate:  113 PR Interval:  145 QRS Duration: 78 QT Interval:  383 QTC Calculation: 525 R Axis:   -64 Text Interpretation:  Atrial fibrillation Inferior infarct, age  indeterminate Anterior infarct, old rate is faster, more irregular  compared to Oct 2016 Confirmed by Regenia Skeeter  MD, [place] [phone]) on 02/05/2015  5:07:28 AM      CRITICAL CARE Performed by: Sherwood Gambler T   Total critical care time: 45 minutes  Critical care time  was exclusive of separately billable procedures and treating other patients.  Critical care was necessary to treat or prevent imminent or life-threatening deterioration.  Critical care was time spent personally by me on the following activities: development of treatment plan with patient and/or surrogate as well as nursing, discussions with consultants, evaluation of patient's response to treatment, examination of patient, obtaining history from patient or surrogate, ordering and performing treatments and interventions, ordering and review of laboratory studies, ordering and review of radiographic studies, pulse oximetry and re-evaluation of patient's condition.  MDM   Final diagnoses:  Metabolic acidosis  Lactic acidosis  Acute kidney injury ([place])    Patient's dyspnea is most likely from his significant metabolic acidosis. Lactate over 8, thus IV fluids and code sepsis activated. Unclear source and was thus given broad antibiotics. BP  Improved significantly with fluids, no longer hypotensive. Catheterized twice with no urine. Bladder scan shows minimal urine, likely pre-renal dehydration. Evidence of AKI. Unclear if lactate is from prolonged afib with RVR with hypotension (hypotensive for EMS and in RVR for almost 10 hours at least). This could explain his elevated troponin as well. He does not talk much but says "no" when asked about CP. No evidence of STEMI. PE is a consideration given left sided hemiparesis, recent stroke and immobility.  Unable to get CT due to creatinine. Will order DVT ultrasounds, consider VQ. However given his BP is no longer hypotensive I do not feel TPA or heparin without diagnosis would be appropriate. Discussed with internal medicine teaching service, they will admit to stepdown.     Sherwood Gambler, MD 02/05/15 [place], MD 02/05/15 [phone]

## 2015-02-05 NOTE — ED Notes (Signed)
Pt was bladder scanned, scanner only showed 17-17mL. Dr. Regenia Skeeter aware.

## 2015-02-05 NOTE — Evaluation (Addendum)
Clinical/Bedside Swallow Evaluation Patient Details  Name: Zachary Leonard MRN: [id number] Date of Birth: 02/08/1932  Today's Date: 02/05/2015 Time: SLP Start Time (ACUTE ONLY): 43 SLP Stop Time (ACUTE ONLY): 1536 SLP Time Calculation (min) (ACUTE ONLY): 13 min  Past Medical History:  Past Medical History  Diagnosis Date  . Cancer [facility])     prostate, treated with XRT  . HTN (hypertension)    Past Surgical History:  Past Surgical History  Procedure Laterality Date  . No past surgeries     HPI:  Zachary Leonard is a 81 y.o. male who has PMH of prostate cancer s/p radiation, CVA, HTN, rhabdomyolosis, AKI, cognitive impairment, and NSTEMI. He was seen by speech during recent admission 11/2014 and was started on a Dys 1 diet and nectar-thick liquids. He presented to the ED with sepsis of unclear origin. CXR showed low lung volumes but without PNA.   Assessment / Plan / Recommendation Clinical Impression  Pt is tachypneic upon SLP arrival, with RR at 36. PO trials of nectar thick liquids result in further rise in RR to the low 40s, followed by significantly weak attempts at coughing/throat clearing. Purees did not elicit the same changes, however given elevated RR at baseline, baseline dysphagia, and cognitive impairments with oral holding, he would be at a high aspiration risk at this time. Would allow meds crushed in puree for now, with f/u on next date to assess for readiness to start PO diet as respirations begin to normalize.q    Aspiration Risk  Severe    Diet Recommendation NPO except meds   Medication Administration: Crushed with puree    Other  Recommendations Oral Care Recommendations: Oral care QID   Follow Up Recommendations   SNF    Frequency and Duration min 2x/week  2 weeks   Pertinent Vitals/Pain See RR as described above    SLP Swallow Goals     Swallow Study Prior Functional Status       General Other Pertinent Information: Zachary Leonard is a 81 y.o. male who has  PMH of prostate cancer s/p radiation, CVA, HTN, rhabdomyolosis, AKI, cognitive impairment, and NSTEMI. He was seen by speech during recent admission 11/2014 and was started on a Dys 1 diet and nectar-thick liquids. He presented to the ED with sepsis of unclear origin. CXR showed low lung volumes but without PNA. Type of Study: Bedside swallow evaluation Previous Swallow Assessment: see HPI Diet Prior to this Study: NPO Temperature Spikes Noted: No History of Recent Intubation: No Behavior/Cognition: Alert;Cooperative;Requires cueing Self-Feeding Abilities: Needs assist Patient Positioning: Upright in bed Baseline Vocal Quality: Low vocal intensity Volitional Cough: Weak    Oral/Motor/Sensory Function Overall Oral Motor/Sensory Function: Impaired at baseline   Ice Chips Ice chips: Not tested   Thin Liquid Thin Liquid: Not tested    Nectar Thick Nectar Thick Liquid: Impaired Presentation: Cup;Spoon;Straw Oral Phase Impairments: Reduced labial seal;Poor awareness of bolus Pharyngeal Phase Impairments: Change in Vital Signs;Cough - Delayed;Suspected delayed Swallow   Honey Thick Honey Thick Liquid: Not tested   Puree Puree: Impaired Presentation: Self Fed;Spoon Pharyngeal Phase Impairments: Suspected delayed Swallow   Solid     Solid: Not tested      Germain Osgood, M.A. CCC-SLP [phone]  Germain Osgood 02/05/2015,3:48 PM

## 2015-02-05 NOTE — ED Notes (Signed)
Pt was in and out cathed, but had no urine at this time.

## 2015-02-05 NOTE — Progress Notes (Signed)
ANTIBIOTIC CONSULT NOTE - INITIAL  Pharmacy Consult for Zosyn, Vancomycin Indication: Sepsis   No Known Allergies  Patient Measurements:   Actual Body Weight: 64 kg   Vital Signs: Temp: 98.3 F (36.8 C) (11/09 0500) Temp Source: Axillary (11/09 0500) BP: 91/64 mmHg (11/09 0500) Pulse Rate: 106 (11/09 0500) Intake/Output from previous day:   Intake/Output from this shift:    Labs:  Recent Labs  02/05/15 0518  HGB 14.6  CREATININE 2.10*   CrCl cannot be calculated (Unknown ideal weight.). No results for input(s): VANCOTROUGH, VANCOPEAK, VANCORANDOM, GENTTROUGH, GENTPEAK, GENTRANDOM, TOBRATROUGH, TOBRAPEAK, TOBRARND, AMIKACINPEAK, AMIKACINTROU, AMIKACIN in the last 72 hours.   Microbiology: No results found for this or any previous visit (from the past 720 hour(s)).  Medical History: Past Medical History  Diagnosis Date  . Cancer [facility])     prostate, treated with XRT  . HTN (hypertension)     Medications:   (Not in a hospital admission) Assessment: 16 YOM with SOB. WBC elevated at 14.6. LA 8.76 . SCr up to 2.1 (BL ~ 1.1). Pharmacy consulted to start empiric antibiotics for code sepsis. CrCl ~ 20-25 mL/min   Goal of Therapy:  Vancomycin trough level 15-20 mcg/ml  Plan:  -Vancomycin 1250 mg IV load followed by Vanc 750 mg IV Q 24 hours -Zosyn 3.375 gm IV Q 8 hours -Monitor CBC, renal fx, cultures and clinical progress -VT at [place], PharmD., BCPS Clinical Pharmacist Pager [phone]

## 2015-02-05 NOTE — ED Notes (Signed)
Attempted report 

## 2015-02-05 NOTE — Progress Notes (Signed)
*  PRELIMINARY RESULTS* Vascular Ultrasound Lower extremity venous duplex has been completed.  Preliminary findings: Occlusive DVT noted in the Left profunda femoral vein and the Left peroneal veins. No DVT RLE.   Landry Mellow, RDMS, RVT  02/05/2015, 11:22 AM

## 2015-02-05 NOTE — Progress Notes (Signed)
Utilization Review Completed.  

## 2015-02-05 NOTE — ED Notes (Signed)
Admitting MD at bedside aware that bed is assigned, pt to be transported to 2 C once MD has completed assessing the pt

## 2015-02-06 ENCOUNTER — Inpatient Hospital Stay (HOSPITAL_COMMUNITY): Payer: Medicare Other

## 2015-02-06 LAB — COMPREHENSIVE METABOLIC PANEL
ALBUMIN: 2.1 g/dL — AB (ref 3.5–5.0)
ALK PHOS: 222 U/L — AB (ref 38–126)
ALT: 92 U/L — AB (ref 17–63)
ANION GAP: 15 (ref 5–15)
AST: 89 U/L — AB (ref 15–41)
BUN: 28 mg/dL — AB (ref 6–20)
CALCIUM: 7.4 mg/dL — AB (ref 8.9–10.3)
CO2: 20 mmol/L — AB (ref 22–32)
Chloride: 111 mmol/L (ref 101–111)
Creatinine, Ser: 2.79 mg/dL — ABNORMAL HIGH (ref 0.61–1.24)
GFR calc Af Amer: 23 mL/min — ABNORMAL LOW (ref >60)
GFR calc non Af Amer: 20 mL/min — ABNORMAL LOW (ref >60)
GLUCOSE: 121 mg/dL — AB (ref 65–99)
Potassium: 4.1 mmol/L (ref 3.5–5.1)
SODIUM: 146 mmol/L — AB (ref 135–145)
Total Bilirubin: 1.5 mg/dL — ABNORMAL HIGH (ref 0.3–1.2)
Total Protein: 6.1 g/dL — ABNORMAL LOW (ref 6.5–8.1)

## 2015-02-06 LAB — URINE CULTURE: Culture: NO GROWTH

## 2015-02-06 LAB — TROPONIN I
TROPONIN I: 1.61 ng/mL — AB (ref <0.031)
Troponin I: 0.96 ng/mL (ref <0.031)

## 2015-02-06 LAB — HEPARIN LEVEL (UNFRACTIONATED)
Heparin Unfractionated: 1.03 IU/mL — ABNORMAL HIGH (ref 0.30–0.70)
Heparin Unfractionated: 0.12 IU/mL — ABNORMAL LOW (ref 0.30–0.70)
Heparin Unfractionated: 0.59 IU/mL (ref 0.30–0.70)

## 2015-02-06 LAB — GLUCOSE, CAPILLARY
GLUCOSE-CAPILLARY: 86 mg/dL (ref 65–99)
GLUCOSE-CAPILLARY: 98 mg/dL (ref 65–99)

## 2015-02-06 LAB — LACTIC ACID, PLASMA: LACTIC ACID, VENOUS: 3.2 mmol/L — AB (ref 0.5–2.0)

## 2015-02-06 MED ORDER — PIPERACILLIN-TAZOBACTAM IN DEX 2-0.25 GM/50ML IV SOLN
2.2500 g | Freq: Three times a day (TID) | INTRAVENOUS | Status: DC
  Administered 2015-02-06 – 2015-02-08 (×6): 2.25 g via INTRAVENOUS
  Filled 2015-02-06 (×8): qty 50

## 2015-02-06 MED ORDER — HEPARIN BOLUS VIA INFUSION
2000.0000 [IU] | Freq: Once | INTRAVENOUS | Status: AC
  Administered 2015-02-06: 2000 [IU] via INTRAVENOUS
  Filled 2015-02-06: qty 2000

## 2015-02-06 MED ORDER — SODIUM CHLORIDE 0.9 % IV SOLN
INTRAVENOUS | Status: AC
  Administered 2015-02-06: 15:00:00 via INTRAVENOUS

## 2015-02-06 MED ORDER — VANCOMYCIN HCL IN DEXTROSE 1-5 GM/200ML-% IV SOLN
1000.0000 mg | INTRAVENOUS | Status: DC
  Administered 2015-02-08: 1000 mg via INTRAVENOUS
  Filled 2015-02-06: qty 200

## 2015-02-06 MED ORDER — HEPARIN (PORCINE) IN NACL 100-0.45 UNIT/ML-% IJ SOLN
750.0000 [IU]/h | INTRAMUSCULAR | Status: DC
  Administered 2015-02-06: 650 [IU]/h via INTRAVENOUS
  Administered 2015-02-08 – 2015-02-09 (×2): 800 [IU]/h via INTRAVENOUS
  Filled 2015-02-06 (×5): qty 250

## 2015-02-06 NOTE — Progress Notes (Signed)
   Subjective: Patient responsive, answers questions with 1-2 word sentences, otherwise nods head up or down. He denies any pain, feels he is improving compared to yesterday. Objective: Vital signs in last 24 hours: Filed Vitals:   02/06/15 0900 02/06/15 1000 02/06/15 1100 02/06/15 1200  BP: 100/70 119/82 129/85 125/84  Pulse: 84 29 44 67  Temp:    97.5 F (36.4 C)  TempSrc:    Axillary  Resp: 9 8 28 29   Height:      Weight:      SpO2: 99% 99% 100% 98%   Weight change:   Intake/Output Summary (Last 24 hours) at 02/06/15 1345 Last data filed at 02/06/15 1100  Gross per 24 hour  Intake 1165.6 ml  Output      0 ml  Net 1165.6 ml   General: resting in bed Cardiac: irregularly irregular, no murmurs heard Pulm: clear to auscultation anteriorly Abd: soft, nontender, nondistended, BS present, small 1 cm sore underneath pannus Ext: no pedal edema Neuro: alert and oriented, slow to respond but does tell me his name, year, and correctly identifies that he is in [place]. Has left sided weakness 2/2 to prior CVA.   Assessment/Plan: Principal Problem:   Sepsis ([place]) Active Problems:   Elevated troponin   AKI (acute kidney injury) ([place])   History of CVA (cerebrovascular accident)   Memory deficit after cerebral infarction   Personal history of prostate cancer   Acute kidney injury ([place])  81 y/o male who presented with hypotension, respiratory distress, hypothermia, and tachycardic irregularly irregular rhythm. Had mild leukocytosis of 11,000 and elevated lactic acid of 8.76 --> 7.21 after ~4L IVF.   Sepsis: Patient with increase in WBC from 11.4 --> 18.8. Currently on Vanc and Zosyn. UA positive for nitrites and small leukocytes pending culture, which is presumed source of infection. Creatinine increased from 2.29 to 2.79, likely from septic process. Also with elevated troponins likely 2/2 to demand ischemia with latest trend of 1.67-->1.61-->0.96. Repeat CXR this morning with  central pulmonary vascular prominence and cardiomegaly without consolidation. Clinically, appears somewhat improved compared to yesterday -Continue Vancomycin and Zosyn -monitor vitals -f/u blood cultures, urine culture -Consider D5 1/2 NS if unable to tolerate anything by mouth, Speech to evaluate, appreciate recommendations -Monitor CBC, CMP  LLE DVT: Occlusive DVT noted in left profunda femoral vein and left peroneal veins, no DVT RLE. Started on IV heparin and TTE without evidence of right heart strain. Will not pursue CT angio at this time due to AKI and no change in management if PE identified. -Continue IV heparin  Acute A. Fib w/ RVR vs. MAT: Patient noted to have irregularly irregular tachycardic heart rhythm and rate on arrival thought to be atrial fibrillation. EKG showing occasional p-waves with varying morphologies, suggesting a multifocal atrial tachycardia. Patient has been on Aspirin and Plavix at home for prior CVA. -Hold Aspirin & Plavix -Continue IV Heparin  AKI: SCr 2.29 yesterday, now 2.79. Baseline appears to be around 1.2 with value of 1.16 2 weeks ago. Likely due to Sepsis and hypovolemia.  -Hold lisinopril and HCTZ -strict I/Os, monitor urine output -IV NS 150 mL/hr for 10 hours  Dispo: Disposition is deferred at this time, awaiting improvement of current medical problems.     The patient does have transportation limitations that hinder transportation to clinic appointments.    LOS: 1 day   Zada Finders, MD 02/06/2015, 1:45 PM

## 2015-02-06 NOTE — Progress Notes (Signed)
CRITICAL VALUE ALERT  Critical value received:  Lactic acid 3.2  Date of notification:  02/06/15  Time of notification:  [id number]  Critical value read back: Yes  Nurse who received alert:  Remo Lipps, RN  MD notified (1st page):  IM resident   Time of first page:  [phone]  Responding MD:  Ysidro Evert  Time MD responded:  [id number]

## 2015-02-06 NOTE — Progress Notes (Signed)
Patient seen and examined. Case d/w residents in detail. I agree with findings and plan as documented in Dr. Serita Grit note.  Patient is more awake and responsive today. No new complaints. Likely sepsis of uncertain etiology possibly UTI. Lactic acidosis resolving but still with leukocytosis (WBC 18.8) today. Will c/w vancomycin and zosyn for now. Cultures with NGTD.   Of note also with newly diagnosed LLE DVT. C/w heparin gtt for now.   Patient also noted to have AKI. Likely ATN secondary to sepsis and hypotension. Will monitor urine output. Maintain BP. Recheck BMP in AM

## 2015-02-06 NOTE — Consult Note (Signed)
Consultation Note Date: 02/06/2015   Patient Name: Zachary Leonard  DOB: 22-Dec-1931  MRN: [id number]  Age / Sex: 81 y.o., male  PCP: Provider Not In System Referring Physician: Aldine Contes, MD  Reason for Consultation: Establishing goals of care    Clinical Assessment/Narrative: I met today with Mr. Lograsso, sister [name], nephew, and cousin along with Dr. Posey Pronto. We followed up update from teaching service to patient and family regarding medical issues currently. Concern for overall poor prognosis and ongoing poor QOL was shared with family.   We discussed the pressing concern over his dysphagia that is unlikely to improve much. He continuously asks for water. Options were explained from comfort feedings to artificial feedings to patient and family. At one point mr. Soules indicates that we wants to have water as a comfort even if this could lead to his death. However when further discussing comfort care and DNR he tells [place] he would want CPR/breathing machine/etc. Sister [name] seemed surprised with this response. He is adamant that he would want to continue aggressive care including full code saying "I wouldn't mind to be on a machine." Explained this could mean that he would not be able to talk or eat/drink with a ETT - he says he doesn't mind and would want this and says "I can make my own decision."  Family is tearful and seem to understand his suffering and poor QOL. They seem more inclined towards a comfort path. However respect his wishes and decisions. [name] says he has always been very private and independent especially with his medical care and health. Mr. Skillen says that he is not worried about anything. Asked if he is scared to die - he says that he doesn't want to die. Family will call his pastor to help him explore these issues and pray with him. Will also involve hospital chaplains. Family encourages him to pray.      Contacts/Participants in Discussion: Primary Decision Maker: [name] Marrow   Relationship to Patient sister HCPOA: no  No official HCPOA  SUMMARY OF RECOMMENDATIONS  Code Status/Advance Care Planning: Full code    Code Status Orders        Start     Ordered   02/05/15 0958  Full code   Continuous     02/05/15 0957      Symptom Management:   No current symptoms.  Dysphasia: Continue to work with SLP. Unfortunately no decisions today on how he will receive nutritional support.   Palliative Prophylaxis:   Bowel Regimen, Delirium Protocol, Oral Care and Turn Reposition  Additional Recommendations (Limitations, Scope, Preferences):  Full Scope Treatment  Psycho-social/Spiritual:  Support System: Adequate Desire for further Chaplaincy support:yes  Prognosis: Very poor. Likely months.   Discharge Planning: To be determined. Likely return to SNF when medically ready.    Chief Complaint/ Primary Diagnoses: Present on Admission:  . Sepsis ([place]) . AKI (acute kidney injury) ([place]) . Elevated troponin  I have reviewed the medical record, interviewed the patient and family, and examined the patient. The following aspects are pertinent.  Past Medical History  Diagnosis Date  . Cancer [facility])     prostate, treated with XRT  . HTN (hypertension)    Social History   Social History  . Marital Status: Single    Spouse Name: N/A  . Number of Children: N/A  . Years of Education: N/A   Social History Main Topics  . Smoking status: Never Smoker   . Smokeless tobacco: None  . Alcohol  Use: No     Comment: quit 10-12 years ago, prior drank 1 glass of wine daily  . Drug Use: No  . Sexual Activity: Not Asked   Other Topics Concern  . None   Social History Narrative   Family History  Problem Relation Age of Onset  . Hypertension     Scheduled Meds: . multivitamin with minerals  1 tablet Oral Daily  . piperacillin-tazobactam (ZOSYN)  IV  2.25 g Intravenous  Q8H  . [START ON 02/07/2015] vancomycin  1,000 mg Intravenous Q48H   Continuous Infusions: . sodium chloride 150 mL/hr at 02/06/15 1432  . heparin 650 Units/hr (02/06/15 0700)   PRN Meds:.acetaminophen **OR** acetaminophen, bisacodyl Medications Prior to Admission:  Prior to Admission medications   Medication Sig Start Date End Date Taking? Authorizing Provider  aspirin EC 81 MG tablet Take 81 mg by mouth daily.   Yes Historical Provider, MD  atorvastatin (LIPITOR) 20 MG tablet Take 1 tablet (20 mg total) by mouth daily at 6 PM. 12/01/14  Yes Vipul Manuella Ghazi, MD  clopidogrel (PLAVIX) 75 MG tablet Take 1 tablet (75 mg total) by mouth daily. 12/01/14  Yes Vipul Manuella Ghazi, MD  lisinopril-hydrochlorothiazide (PRINZIDE,ZESTORETIC) 10-12.5 MG per tablet Take 1 tablet by mouth daily.   Yes Historical Provider, MD  Multiple Vitamin (MULTIVITAMIN WITH MINERALS) TABS tablet Take 1 tablet by mouth daily.   Yes Historical Provider, MD   No Known Allergies  Review of Systems  Unable to perform ROS   Physical Exam  Constitutional: Vital signs are normal. He appears well-developed.  HENT:  Head: Normocephalic.  Cardiovascular: Normal rate.  An irregular rhythm present.  Respiratory: Effort normal. No accessory muscle usage. No tachypnea. No respiratory distress.  GI: Soft. Normal appearance. He exhibits no distension.  Neurological: He is alert.  Answers questions appropriately. Difficult to say how much he really understands - unable to reiterate his medical situation.   Psychiatric: He has a normal mood and affect.    Vital Signs: BP 124/100 mmHg  Pulse 76  Temp(Src) 97.5 F (36.4 C) (Axillary)  Resp 6  Ht _0  (1.702 m)  Wt 72.938 kg (160 lb 12.8 oz)  BMI 25.18 kg/m2  SpO2 95%  SpO2: SpO2: 95 % O2 Device:SpO2: 95 % O2 Flow Rate: .O2 Flow Rate (L/min): 4 L/min  IO: Intake/output summary:  Intake/Output Summary (Last 24 hours) at 02/06/15 1442 Last data filed at 02/06/15 1100  Gross per 24  hour  Intake 1006.1 ml  Output      0 ml  Net 1006.1 ml    LBM: Last BM Date: 02/05/15 Baseline Weight: Weight: 68.13 kg (150 lb 3.2 oz) Most recent weight: Weight: 72.938 kg (160 lb 12.8 oz)      Palliative Assessment/Data:  Flowsheet Rows        Most Recent Value   Intake Tab    Referral Department  -- [internal medicine]   Unit at Time of Referral  Intermediate Care Unit   Palliative Care Primary Diagnosis  Sepsis/Infectious Disease   Date Notified  02/06/15   Palliative Care Type  Return patient Palliative Care   Reason for referral  Clarify Goals of Care   Date of Admission  02/05/15   Date first seen by Palliative Care  02/06/15   # of days Palliative referral response time  0 Day(s)   # of days IP prior to Palliative referral  1   Clinical Assessment    Psychosocial & Spiritual Assessment  Palliative Care Outcomes       Additional Data Reviewed:  CBC:    Component Value Date/Time   WBC 18.8* 02/06/2015 0355   HGB 11.6* 02/06/2015 0355   HCT 36.6* 02/06/2015 0355   PLT 232 02/06/2015 0355   MCV 97.6 02/06/2015 0355   NEUTROABS 8.4* 02/05/2015 0506   LYMPHSABS 1.4 02/05/2015 0506   MONOABS 1.6* 02/05/2015 0506   EOSABS 0.0 02/05/2015 0506   BASOSABS 0.0 02/05/2015 0506   Comprehensive Metabolic Panel:    Component Value Date/Time   NA 146* 02/06/2015 0355   K 4.1 02/06/2015 0355   CL 111 02/06/2015 0355   CO2 20* 02/06/2015 0355   BUN 28* 02/06/2015 0355   CREATININE 2.79* 02/06/2015 0355   GLUCOSE 121* 02/06/2015 0355   CALCIUM 7.4* 02/06/2015 0355   AST 89* 02/06/2015 0355   ALT 92* 02/06/2015 0355   ALKPHOS 222* 02/06/2015 0355   BILITOT 1.5* 02/06/2015 0355   PROT 6.1* 02/06/2015 0355   ALBUMIN 2.1* 02/06/2015 0355     Time In: 1040 Time Out: 1200 Time Total: 59mn Greater than 50%  of this time was spent counseling and coordinating care related to the above assessment and plan.  Signed by: PPershing Proud NP  APershing Proud NP   02/06/2015, 2:42 PM  Please contact Palliative Medicine Team phone at [phone] questions and concerns.

## 2015-02-06 NOTE — Progress Notes (Signed)
Pharmacy Antibiotic Time-Out Note  Zachary Leonard is a 81 y.o. year-old male admitted on 02/05/2015.  The patient is currently on Zosyn and vancomycin for sepsis.  Assessment/Plan: Day #2 of abx for sepsis. Presented w/ hypotension, resp distress, hypothermia. Afebrile, WBC elevated at 18.8. LA down to 3.2 (peaked at 8.76) CXR neg for PNA but dry. SCr back up to 2.79, CrCL ~15-67ml/min. Minimal UOP charted but did have 1 occurrence.  Change vancomycin to 1g IV Q48 Change Zosyn to 2.25g IV Q8 Monitor clinical picture, renal function, VT at Css F/U C&S, abx deescalation / LOT   Recent Labs Lab 02/05/15 0506 02/06/15 0355  WBC 11.4* 18.8*    Recent Labs Lab 02/05/15 0506 02/05/15 0518 02/06/15 0355  CREATININE 2.29* 2.10* 2.79*   Estimated Creatinine Clearance: 19.4 mL/min (by C-G formula based on Cr of 2.79). Tmax/24h 100.0   Antimicrobials this admission: Vanc 11/9 >> Zosyn 11/9 >>  Microbiology Results: 11/9 BCx2 > pending 11/0 Urine cx > ngtd  Thank you for allowing pharmacy to be a part of this patient's care.  Reginia Naas PharmD 02/06/2015 9:16 AM

## 2015-02-06 NOTE — Progress Notes (Signed)
ANTICOAGULATION CONSULT NOTE - Follow Up Consult  Pharmacy Consult for Heparin  Indication: DVT  No Known Allergies  Patient Measurements: Height: 5\' 7"  (170.2 cm) (estimated) Weight: 160 lb 12.8 oz (72.938 kg) IBW/kg (Calculated) : 66.1  Vital Signs: Temp: 97.5 F (36.4 C) (11/10 1200) Temp Source: Axillary (11/10 1200) BP: 124/100 mmHg (11/10 1400) Pulse Rate: 76 (11/10 1400)  Labs:  Recent Labs  02/05/15 0506 02/05/15 0518  02/05/15 1908 02/05/15 2032 02/06/15 0355 02/06/15 1052 02/06/15 1514  HGB 12.1* 14.6  --   --   --  11.6*  --   --   HCT 37.7* 43.0  --   --   --  36.6*  --   --   PLT 229  --   --   --   --  232  --   --   HEPARINUNFRC  --   --   --  0.94*  --  1.03*  --  0.12*  CREATININE 2.29* 2.10*  --   --   --  2.79*  --   --   CKTOTAL 47*  --   --   --   --   --   --   --   TROPONINI  --   --   < >  --  1.67* 1.61* 0.96*  --   < > = values in this interval not displayed.  Estimated Creatinine Clearance: 19.4 mL/min (by C-G formula based on Cr of 2.79).   Assessment: 81 yo M admitted 02/05/2015 with new L LE DVT on heparin.  HL 0.12 (subtherapeutic) on 650 units/h. Hgb 11.6, Plts wnl. No s/sx of bleeding or IV line issues per RN  Goal of Therapy:  Heparin level 0.3-0.7 units/ml Monitor platelets by anticoagulation protocol: Yes   Plan:  -Heparin 2000 unit bolus -Increase heparin to 800 units/h -8h HL -Daily CBC/HL -Monitor for bleeding  Elicia Lamp, PharmD Clinical Pharmacist Pager [phone] 02/06/2015 4:44 PM

## 2015-02-06 NOTE — Progress Notes (Signed)
Speech Pathology  MBSS complete. Full report located under chart review in imaging section. Look in DG swallow function.  Recommendations: Pt exhibited a moderate oral dysphagia with delayed transit and lingual residue. Suspect primary esophageal dysphagia with evidence of cricopharyngeal bar and poor clearance of esophagram (full of barium stasis at end of study). Decreased sensation led to swallow initiation to the pyriform sinuses (filling pyriforms prior to swallow initation). Moderate aspiration, sensed with cough, during the swallow with thin liquids. Pt's aspiration risk is significant due to increased respiratory effort (RR up to 47 x 1 during study), poor endurance, fatigue and poor reserve. Consumption of any po's will likely lead to eventual aspiration. Discussed results with pt, Dr. Posey Pronto, Palliative care RN Elmo Putt) and pt's niece Kennyth Lose). Discussions regarding wishes in regards to po's (pt verbalized his desire for "water") recommended by MD to pt/family.   Zachary Leonard [place].Ed [employer] [phone]

## 2015-02-06 NOTE — Progress Notes (Signed)
Speech Language Pathology Treatment: Dysphagia  Patient Details Name: Zachary Leonard MRN: [id number] DOB: 15-Aug-1931 Today's Date: 02/06/2015 Time: 1202-1220 SLP Time Calculation (min) (ACUTE ONLY): 18 min  Assessment / Plan / Recommendation Clinical Impression  Pt seen at bedside for initiation of po's if appropriate following initial assessment yesterday. Prolonged bolus formation/oral transit, suspected delayed swallow and increase in work of breathing present. MBS needed to fully assess swallow function to make safest recommendation for pt with history of dysphagia. MBS scheduled for today at 1330.    HPI HPI: Zachary Leonard is a 81 y.o. male who has PMH of prostate cancer s/p radiation, CVA, HTN, rhabdomyolosis, AKI, cognitive impairment, and NSTEMI. He was seen by speech during recent admission 11/2014 and was started on a Dys 1 diet and nectar-thick liquids. He presented to the ED with sepsis of unclear origin. CXR showed low lung volumes but without PNA.      SLP Plan  MBS     Recommendations  Diet recommendations: NPO              Oral Care Recommendations: Oral care QID Follow up Recommendations: Skilled Nursing facility Plan: MBS   [place] Siren 02/06/2015, 2:28 PM  [name] Col[place].Ed [employer] [phone]

## 2015-02-06 NOTE — Progress Notes (Signed)
Palliative Note:   I met today briefly with Mr. Utz. I spoke with SLP about MBS and concern with all consistencies. He is asking for water. I offered for him to talk with myself and his sister, [name], about goals for himself moving forward. He agrees. I spoke with [name] and she agrees to meet with [place] 02/06/15 1100 am. Thank you for this consult.   Vinie Sill, NP Palliative Medicine Team Pager # [phone] (M-F 8a-5p) Team Phone # [phone] (Nights/Weekends)

## 2015-02-06 NOTE — Evaluation (Signed)
Physical Therapy Evaluation Patient Details Name: Dona Towsend MRN: [id number] DOB: Aug 14, 1931 Today's Date: 02/06/2015   History of Present Illness  82 y/o male with PMH of prostate cancer s/p RT, CVA, HTN, NSTEMI p/w worsening SOB from SNF. He was unable to provide a good history. 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 He presents to the ED by EMS from [facility] with dyspnea only able to speak in 1-2 word sentences and found to be in afib with RVR with lactic acidosis and hypotension  Clinical Impression  Pt admitted with above diagnosis. Pt currently with functional limitations due to the deficits listed below (see PT Problem List). Pt was incontinent therefore evaluated at bed level. Will need SNF with therapy on d/c.   Pt will benefit from skilled PT to increase their independence and safety with mobility to allow discharge to the venue listed below.      Follow Up Recommendations SNF;Supervision/Assistance - 24 hour    Equipment Recommendations  Other (comment) (TBA)    Recommendations for Other Services       Precautions / Restrictions Precautions Precautions: Fall Restrictions Weight Bearing Restrictions: No LUE Weight Bearing: Weight bearing as tolerated LLE Weight Bearing: Weight bearing as tolerated      Mobility  Bed Mobility Overal bed mobility: Needs Assistance;+2 for physical assistance Bed Mobility: Rolling Rolling: Total assist;+2 for physical assistance         General bed mobility comments: Upon arrival, noted pt with +BM in bed.  Noted a bloody sore above pubic area as well and notified MD and nursing.  It kept oozing.  Pt rolled right and left with max assist to left with pt needing assist to reach for rail.  Pt needed assist to stay on his side as well at max assist.  Pt rolled to right with total assist.  Cleaned pt with total assist and changed linens to include gown as it had the bloody  drainage on it.  Pt positioned on side with pillows as well as pillows under UE and LES.  Pt comfortable.    Transfers                    Ambulation/Gait                Stairs            Wheelchair Mobility    Modified Rankin (Stroke Patients Only)       Balance                                             Pertinent Vitals/Pain Pain Assessment: Faces Faces Pain Scale: Hurts little more Pain Location: with leg movements to roll Pain Descriptors / Indicators: Moaning Pain Intervention(s): Limited activity within patient's tolerance;Monitored during session;Repositioned  VSS    Home Living Family/patient expects to be discharged to:: Skilled nursing facility                 Additional Comments: Was in [place].  Before CVA he was Independent and ambulated.      Prior Function Level of Independence: Needs assistance   Gait / Transfers Assistance Needed: Stand pivot to chair with 1 person.    ADL's / Homemaking Assistance Needed: Total assist for bath and dressing  Hand Dominance   Dominant Hand: Right    Extremity/Trunk Assessment   Upper Extremity Assessment: Defer to OT evaluation           Lower Extremity Assessment: RLE deficits/detail;LLE deficits/detail RLE Deficits / Details: grossly 2+/5 LLE Deficits / Details: appears grossly 2-/5     Communication   Communication: Expressive difficulties;Other (comment) (slurred speech)  Cognition Arousal/Alertness: Awake/alert Behavior During Therapy: WFL for tasks assessed/performed Overall Cognitive Status: History of cognitive impairments - at baseline                      General Comments      Exercises        Assessment/Plan    PT Assessment Patient needs continued PT services  PT Diagnosis Generalized weakness   PT Problem List Decreased activity tolerance;Decreased balance;Decreased mobility;Decreased knowledge of use of  DME;Decreased safety awareness;Decreased knowledge of precautions;Decreased skin integrity  PT Treatment Interventions DME instruction;Functional mobility training;Therapeutic activities;Therapeutic exercise;Balance training;Patient/family education;Cognitive remediation   PT Goals (Current goals can be found in the Care Plan section) Acute Rehab PT Goals Patient Stated Goal: unable to state PT Goal Formulation: With patient Time For Goal Achievement: 02/20/15 Potential to Achieve Goals: Good    Frequency Min 2X/week   Barriers to discharge Decreased caregiver support      Co-evaluation PT/OT/SLP Co-Evaluation/Treatment: Yes Reason for Co-Treatment: Complexity of the patient's impairments (multi-system involvement) PT goals addressed during session: Mobility/safety with mobility OT goals addressed during session: ADL's and self-care;Strengthening/ROM       End of Session   Activity Tolerance: Patient limited by fatigue (limited by incontinence) Patient left: in bed;with call bell/phone within reach Nurse Communication: Mobility status;Need for lift equipment         Time: 1010-1037 PT Time Calculation (min) (ACUTE ONLY): 27 min   Charges:   PT Evaluation $Initial PT Evaluation Tier I: 1 Procedure     PT G CodesDenice Paradise 02-12-15, 11:57 AM  [facility] [phone] (pager)

## 2015-02-06 NOTE — Evaluation (Signed)
Occupational Therapy Evaluation Patient Details Name: Zachary Leonard MRN: [id number] DOB: 1931-04-19 Today's Date: 02/06/2015    History of Present Illness 81 y/o male with PMH of prostate cancer s/p RT, CVA, HTN, NSTEMI p/w worsening SOB from SNF. He was unable to provide a good history. 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 He presents to the ED by EMS from [facility] with dyspnea only able to speak in 1-2 word sentences and found to be in afib with RVR with lactic acidosis and hypotension   Clinical Impression   This 81 yo male admitted with above presents to acute OT with decreased communication, decreased movement of left side, delayed verbal and physical responses when working with him, decreased mobility, decreased balance even pta, inattention to left side and decreased movement of head and eyes to left side all affecting his ability to take care of himself and help to take care of himself. He will benefit from acute OT with follow up at SNF to work towards his highest level of independence.    Follow Up Recommendations  SNF    Equipment Recommendations   (TBD at next venue)       Precautions / Restrictions Precautions Precautions: Fall Restrictions Weight Bearing Restrictions: No LUE Weight Bearing: Weight bearing as tolerated LLE Weight Bearing: Weight bearing as tolerated      Mobility Bed Mobility Overal bed mobility: Needs Assistance;+2 for physical assistance Bed Mobility: Rolling Rolling: Total assist;+2 for physical assistance         General bed mobility comments: Upon arrival, noted pt with +BM in bed.  Noted a bloody sore above pubic area as well and notified MD and nursing.  It kept oozing.  Pt rolled right and left with max assist to left with pt needing assist to reach for rail.  Pt needed assist to stay on his side as well at max assist.  Pt rolled to right with total assist.  Cleaned pt with  total assist and changed linens to include gown as it had the bloody drainage on it.  Pt positioned on side with pillows as well as pillows under UE and LES.  Pt comfortable.            ADL Overall ADL's : Needs assistance/impaired Eating/Feeding: Total assistance;Bed level   Grooming: Total assistance;Bed level   Upper Body Bathing: Total assistance;Bed level   Lower Body Bathing: Total assistance;Bed level   Upper Body Dressing : Total assistance;Bed level   Lower Body Dressing: Total assistance;Bed level       Toileting- Clothing Manipulation and Hygiene: Total assistance;+2 for physical assistance;Bed level               Vision Additional Comments: Appears to be no change from prior CVA  (August of 2016). Tendency for head and eyes to right (can turn his head/eyes to left slightly past midline). Reports he wears glasses only for distance.          Pertinent Vitals/Pain Pain Assessment: Faces Faces Pain Scale: Hurts little more Pain Location: with leg movements to roll Pain Descriptors / Indicators: Moaning Pain Intervention(s): Limited activity within patient's tolerance;Monitored during session;Repositioned     Hand Dominance Right   Extremity/Trunk Assessment Upper Extremity Assessment Upper Extremity Assessment: LUE deficits/detail;RUE deficits/detail RUE Deficits / Details: can fully extend at shoulder and can grip with right hand--movements are very delayed LUE Deficits / Details: minimal active movement noted LUE Coordination: decreased  fine motor;decreased gross motor   Lower Extremity Assessment Lower Extremity Assessment: RLE deficits/detail;LLE deficits/detail RLE Deficits / Details: grossly 2+/5 LLE Deficits / Details: appears grossly 2-/5 LLE: Unable to fully assess due to pain       Communication Communication Communication: Expressive difficulties;Other (comment) (slurred speech)   Cognition Arousal/Alertness: Awake/alert Behavior  During Therapy: WFL for tasks assessed/performed Overall Cognitive Status: History of cognitive impairments - at baseline                                Home Living Family/patient expects to be discharged to:: Skilled nursing facility                                 Additional Comments: Was in [place].  Before CVA he was Independent and ambulated.        Prior Functioning/Environment Level of Independence: Needs assistance  Gait / Transfers Assistance Needed: Stand pivot to chair with 1 person.   ADL's / Homemaking Assistance Needed: Total assist for bath and dressing Communication / Swallowing Assistance Needed: thickened liquids as pt had swallowing issues      OT Diagnosis: Generalized weakness;Cognitive deficits;Disturbance of vision;Hemiplegia non-dominant side   OT Problem List: Decreased strength;Decreased range of motion;Impaired balance (sitting and/or standing);Impaired UE functional use;Impaired vision/perception;Decreased cognition;Impaired tone   OT Treatment/Interventions: Self-care/ADL training;Patient/family education;Visual/perceptual remediation/compensation;Balance training;Therapeutic activities;DME and/or AE instruction;Neuromuscular education    OT Goals(Current goals can be found in the care plan section) Acute Rehab OT Goals Patient Stated Goal: unable to state OT Goal Formulation: Patient unable to participate in goal setting Time For Goal Achievement: 02/20/15 Potential to Achieve Goals: Fair  OT Frequency: Min 2X/week           Co-evaluation PT/OT/SLP Co-Evaluation/Treatment: Yes Reason for Co-Treatment: Complexity of the patient's impairments (multi-system involvement) PT goals addressed during session: Mobility/safety with mobility OT goals addressed during session: ADL's and self-care;Strengthening/ROM      End of Session Nurse Communication: Mobility status (left in bed)  Activity Tolerance: Patient  tolerated treatment well Patient left: in bed;with call bell/phone within reach;with bed alarm set   Time: [id number] OT Time Calculation (min): 30 min Charges:  OT General Charges $OT Visit: 1 Procedure OT Evaluation $Initial OT Evaluation Tier I: 1 Procedure  Almon Register [id number] 02/06/2015, 2:07 PM

## 2015-02-06 NOTE — Progress Notes (Signed)
ANTICOAGULATION CONSULT NOTE - Follow Up Consult  Pharmacy Consult for Heparin  Indication: DVT  No Known Allergies  Patient Measurements: Height: 5\' 7"  (170.2 cm) (estimated) Weight: 160 lb 12.8 oz (72.938 kg) IBW/kg (Calculated) : 66.1  Vital Signs: Temp: 99.1 F (37.3 C) (11/10 0400) Temp Source: Rectal (11/10 0400) BP: 113/85 mmHg (11/10 0400) Pulse Rate: 40 (11/10 0400)  Labs:  Recent Labs  02/05/15 0506 02/05/15 0518 02/05/15 1430 02/05/15 1908 02/05/15 2032 02/06/15 0355  HGB 12.1* 14.6  --   --   --  11.6*  HCT 37.7* 43.0  --   --   --  36.6*  PLT 229  --   --   --   --  232  HEPARINUNFRC  --   --   --  0.94*  --  1.03*  CREATININE 2.29* 2.10*  --   --   --   --   CKTOTAL 47*  --   --   --   --   --   TROPONINI  --   --  1.36*  1.27*  --  1.67*  --     Estimated Creatinine Clearance: 25.8 mL/min (by C-G formula based on Cr of 2.1).   Assessment: Elevated heparin level despite rate decrease, appears to be drawn correctly  Goal of Therapy:  Heparin level 0.3-0.7 units/ml Monitor platelets by anticoagulation protocol: Yes   Plan:  -Hold heparin x 1 hour -Re-start heparin at 650 units/hr at 0530 -1400 HL -Daily CBC/HL -Monitor for bleeding  Narda Bonds 02/06/2015,4:32 AM

## 2015-02-07 ENCOUNTER — Inpatient Hospital Stay (HOSPITAL_COMMUNITY): Payer: Medicare Other

## 2015-02-07 DIAGNOSIS — Z8673 Personal history of transient ischemic attack (TIA), and cerebral infarction without residual deficits: Secondary | ICD-10-CM

## 2015-02-07 DIAGNOSIS — Z515 Encounter for palliative care: Secondary | ICD-10-CM

## 2015-02-07 DIAGNOSIS — R1310 Dysphagia, unspecified: Secondary | ICD-10-CM | POA: Insufficient documentation

## 2015-02-07 LAB — COMPREHENSIVE METABOLIC PANEL
ALK PHOS: 315 U/L — AB (ref 38–126)
ALT: 185 U/L — AB (ref 17–63)
AST: 147 U/L — AB (ref 15–41)
Albumin: 2 g/dL — ABNORMAL LOW (ref 3.5–5.0)
Anion gap: 14 (ref 5–15)
BUN: 41 mg/dL — AB (ref 6–20)
CALCIUM: 7.4 mg/dL — AB (ref 8.9–10.3)
CHLORIDE: 115 mmol/L — AB (ref 101–111)
CO2: 17 mmol/L — AB (ref 22–32)
Creatinine, Ser: 2.9 mg/dL — ABNORMAL HIGH (ref 0.61–1.24)
GFR calc Af Amer: 22 mL/min — ABNORMAL LOW (ref >60)
GFR calc non Af Amer: 19 mL/min — ABNORMAL LOW (ref >60)
Glucose, Bld: 96 mg/dL (ref 65–99)
Potassium: 5.2 mmol/L — ABNORMAL HIGH (ref 3.5–5.1)
SODIUM: 146 mmol/L — AB (ref 135–145)
Total Bilirubin: 1.9 mg/dL — ABNORMAL HIGH (ref 0.3–1.2)
Total Protein: 5.6 g/dL — ABNORMAL LOW (ref 6.5–8.1)

## 2015-02-07 LAB — CBC
HCT: 35.4 % — ABNORMAL LOW (ref 39.0–52.0)
HEMATOCRIT: 36.6 % — AB (ref 39.0–52.0)
HEMOGLOBIN: 11.6 g/dL — AB (ref 13.0–17.0)
HEMOGLOBIN: 11.6 g/dL — AB (ref 13.0–17.0)
MCH: 30.9 pg (ref 26.0–34.0)
MCH: 31.1 pg (ref 26.0–34.0)
MCHC: 31.7 g/dL (ref 30.0–36.0)
MCHC: 32.8 g/dL (ref 30.0–36.0)
MCV: 97.6 fL (ref 78.0–100.0)
MCV: 94.9 fL (ref 78.0–100.0)
PLATELETS: 171 10*3/uL (ref 150–400)
Platelets: 232 10*3/uL (ref 150–400)
RBC: 3.75 MIL/uL — ABNORMAL LOW (ref 4.22–5.81)
RBC: 3.73 MIL/uL — ABNORMAL LOW (ref 4.22–5.81)
RDW: 15.2 % (ref 11.5–15.5)
RDW: 15.3 % (ref 11.5–15.5)
WBC: 18.8 10*3/uL — ABNORMAL HIGH (ref 4.0–10.5)
WBC: 17.5 10*3/uL — ABNORMAL HIGH (ref 4.0–10.5)

## 2015-02-07 LAB — HEPARIN LEVEL (UNFRACTIONATED): Heparin Unfractionated: 0.67 IU/mL (ref 0.30–0.70)

## 2015-02-07 LAB — LACTIC ACID, PLASMA: Lactic Acid, Venous: 2.7 mmol/L (ref 0.5–2.0)

## 2015-02-07 MED ORDER — DEXTROSE-NACL 5-0.45 % IV SOLN
INTRAVENOUS | Status: AC
  Administered 2015-02-07: 1000 mL via INTRAVENOUS

## 2015-02-07 NOTE — Progress Notes (Addendum)
Pt had only 188ml out in condom cath all night (7p-7a). Pt bladder scanned and max was 13mls. Will pass along to day shift. MD made aware

## 2015-02-07 NOTE — Progress Notes (Signed)
CRITICAL VALUE ALERT  Critical value received: Lactic Acid 2.7  Date of notification:  02/07/15  Time of notification:  [id number]  Critical value read back:Yes.    Nurse who received alert:  Will Bonnet, RN  MD notified (1st page):  Teaching Service MD  Time of first page:  21  MD notified (2nd page):  Time of second page:  Responding MD:    Time MD responded:  1020

## 2015-02-07 NOTE — Clinical Documentation Improvement (Signed)
Internal Medicine  Can the diagnosis of Respiratory distress be further specified in progress notes and discharge summary   Acute respiratory failure  Acute on Chronic respiratory failure  Chronic respiratory failure  Other  Clinically Undetermined  Document any associated diagnoses/conditions.   Supporting Information:  Admitted with sepsis  H&P: Chief complaint: Shortness of Breath. Respiratory distress; Acute dyspnea.  Swallow eval:  Pt's aspiration risk is significant due to increased respiratory effort  11/10 11/10 & 11/11 progress note: 81 y/o male who presented with hypotension, respiratory distress, hypothermia, and tachycardic irregularly irregular rhythm  RR=18-43 breath/min.   Component     Latest Ref Rng 02/05/2015         5:47 AM  pH, Arterial     7.350 - 7.450 7.236 (L)  pCO2 arterial     35.0 - 45.0 mmHg 16.7 (LL)  pO2, Arterial     80.0 - 100.0 mmHg 43.0 (L)  Bicarbonate     20.0 - 24.0 mEq/L 7.1 (L)  TCO2     0 - 100 mmol/L 8  O2 Saturation      72.0  Acid-base deficit     0.0 - 2.0 mmol/L 19.0 (H)  Patient temperature      98.3 F  Sample type      ARTERIAL  Comment      NOTIFIED PHYSICIAN   Treatments:  11/9 Nonrebreather 10L/m Currently O2 per [place] 4L/m Incentive spirometry Continuous pulse Ox Keep O2 sats greater than 92%  Please exercise your independent, professional judgment when responding. A specific answer is not anticipated or expected.   Thank You,  [place] [phone]

## 2015-02-07 NOTE — Care Management Important Message (Signed)
Important Message  Patient Details  Name: Zachary Leonard MRN: [id number] Date of Birth: 1931/05/07   Medicare Important Message Given:  Yes    Epiphany Seltzer P Danyeal Akens 02/07/2015, 1:08 PM

## 2015-02-07 NOTE — Progress Notes (Signed)
ANTICOAGULATION CONSULT NOTE - Follow Up Consult  Pharmacy Consult for Heparin  Indication: DVT  No Known Allergies  Patient Measurements: Height: 5\' 7"  (170.2 cm) (estimated) Weight: 164 lb (74.39 kg) IBW/kg (Calculated) : 66.1  Vital Signs: Temp: 96.8 F (36 C) (11/11 1200) Temp Source: Oral (11/11 1200) BP: 151/75 mmHg (11/11 1200) Pulse Rate: 49 (11/11 1200)  Labs:  Recent Labs  02/05/15 0506 02/05/15 0518  02/05/15 2032 02/06/15 0355 02/06/15 1052 02/06/15 1514 02/06/15 2320 02/07/15 0622  HGB 12.1* 14.6  --   --  11.6*  --   --   --  11.6*  HCT 37.7* 43.0  --   --  36.6*  --   --   --  35.4*  PLT 229  --   --   --  232  --   --   --  171  HEPARINUNFRC  --   --   < >  --  1.03*  --  0.12* 0.59 0.67  CREATININE 2.29* 2.10*  --   --  2.79*  --   --   --  2.90*  CKTOTAL 47*  --   --   --   --   --   --   --   --   TROPONINI  --   --   < > 1.67* 1.61* 0.96*  --   --   --   < > = values in this interval not displayed.  Estimated Creatinine Clearance: 18.7 mL/min (by C-G formula based on Cr of 2.9).   Assessment: 81 yo M admitted 02/05/2015 with new L LE DVT on heparin. Heparin level this morning was therapeutic (HL 0.67 << 0.59, goal of 0.3-0.7). Hgb/Hct stable, plts down to 171 << 232. No s/sx of bleeding noted.   Goal of Therapy:  Heparin level 0.3-0.7 units/ml Monitor platelets by anticoagulation protocol: Yes   Plan:  1. Continue heparin at 800 units/hr  2. Will continue to monitor for any signs/symptoms of bleeding and will follow up with heparin level in the a.m.   Alycia Rossetti, PharmD, BCPS Clinical Pharmacist Pager: [phone] 02/07/2015 1:52 PM

## 2015-02-07 NOTE — Progress Notes (Signed)
ANTICOAGULATION CONSULT NOTE - Follow Up Consult  Pharmacy Consult for Heparin  Indication: DVT  No Known Allergies  Patient Measurements: Height: 5\' 7"  (170.2 cm) (estimated) Weight: 160 lb 12.8 oz (72.938 kg) IBW/kg (Calculated) : 66.1  Vital Signs: Temp: 97.5 F (36.4 C) (11/10 2300) Temp Source: Oral (11/10 2300) BP: 144/89 mmHg (11/10 2300) Pulse Rate: 44 (11/10 2300)  Labs:  Recent Labs  02/05/15 0506 02/05/15 0518  02/05/15 2032 02/06/15 0355 02/06/15 1052 02/06/15 1514 02/06/15 2320  HGB 12.1* 14.6  --   --  11.6*  --   --   --   HCT 37.7* 43.0  --   --  36.6*  --   --   --   PLT 229  --   --   --  232  --   --   --   HEPARINUNFRC  --   --   < >  --  1.03*  --  0.12* 0.59  CREATININE 2.29* 2.10*  --   --  2.79*  --   --   --   CKTOTAL 47*  --   --   --   --   --   --   --   TROPONINI  --   --   < > 1.67* 1.61* 0.96*  --   --   < > = values in this interval not displayed.  Estimated Creatinine Clearance: 19.4 mL/min (by C-G formula based on Cr of 2.79).   Assessment: Therapeutic heparin level x 1 after rate increase  Goal of Therapy:  Heparin level 0.3-0.7 units/ml Monitor platelets by anticoagulation protocol: Yes   Plan:  -Continue heparin at 800 units/hr -HL with AM labs  Narda Bonds 02/07/2015,12:05 AM

## 2015-02-07 NOTE — Progress Notes (Signed)
Subjective: Patient seen while his sister, niece, and nephew are present. He denies any pain at this time. Vinie Sill, NP from Palliative Care and I spoke at length with family and patient concerning goals of care and his current medical status. When asked about aggressive measures (CPR, Intubation) vs comfort care, patient states that he "doesn't mind being hooked up to a machine" and " I can make my own decision." He does state he would like his family to call his pastor to ask for prayers. His family seems to understand patient's current status and poor prospects for significant improvement and are leaning towards comfort care, but understand patient's wishes and agree that it is his choice. Objective: Vital signs in last 24 hours: Filed Vitals:   02/07/15 0400 02/07/15 0500 02/07/15 0806 02/07/15 1200  BP: 156/109 130/100 147/115 151/75  Pulse: 30  88 49  Temp: 98.2 F (36.8 C)  97 F (36.1 C) 96.8 F (36 C)  TempSrc: Oral  Axillary Oral  Resp: 6 7 10 5   Height:      Weight:  164 lb (74.39 kg)    SpO2: 94%  99% 97%   Weight change: 13 lb 12.8 oz (6.26 kg)  Intake/Output Summary (Last 24 hours) at 02/07/15 1349 Last data filed at 02/06/15 1800  Gross per 24 hour  Intake 570.15 ml  Output      0 ml  Net 570.15 ml   General: resting in bed Cardiac: irregularly irregular, no murmurs heard Pulm: clear to auscultation anteriorly Abd: soft, nontender, nondistended, BS present GU: Condom cath in place Ext: no pedal edema, SCDs on RLE Neuro: alert and oriented, slow to respond but does answer questions. Has left sided weakness 2/2 to prior CVA. EOM intact, but does not follow command to follow finger with eyes only.   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   Sepsis: Patient with increase in WBC from 11.4 --> 18.8 after admission, 17.5 this morning. Currently on Vanc and Zosyn. UA positive for nitrites and small leukocytes pending culture, which is presumed source of infection. Creatinine increased from 2.29 to 2.79 now 2.9, likely from septic process. Also with elevated troponins likely 2/2 to demand ischemia with latest trend of 1.67-->1.61-->0.96. Clinically, appears somewhat improved compared to yesterday -Continue Vancomycin and Zosyn -monitor vitals -f/u blood cultures, urine culture --> NGTD -D5 1/2 NS for 12 hours -CBC, RFP  LLE DVT: Occlusive DVT noted in left profunda femoral vein and left peroneal veins, no DVT RLE. Started on IV heparin and TTE without evidence of right heart strain. Will not pursue CT angio at this time due to AKI and no change in management if PE identified. Repeat CXR on 11/10 with central pulmonary vascular prominence and cardiomegaly without consolidation which may indicate underlying PE. -Continue IV heparin  AKI: SCr 2.29 -->2.79-->2.90 . Baseline appears to be around 1.2 with value of 1.16 2 weeks ago. Thought to be due to Sepsis and hypovolemia, however patient not producing urine despite over 4 Liters fluid resuscitation since arrival. He produced only 150 mLs urine overnight with max  bladder scan showing 75 mLs.  -Hold lisinopril and HCTZ -strict I/Os, monitor urine output -Consult Nephrology, appreciate any recommendations -US Abdomen to view kidneys and RUQ  Acute A. Fib w/ RVR vs. MAT: Patient noted to have irregularly irregular tachycardic heart rhythm and rate on arrival thought to be atrial fibrillation. EKG showing occasional p-waves with varying morphologies, suggesting a multifocal atrial tachycardia.  Patient has been on Aspirin and Plavix at home for prior CVA -Hold Aspirin & Plavix -Continue IV Heparin  Dysphagia: Swallowing study yesterday showed significant risk for aspiration even with thin liquids. Patient asks for water, but we explained that his difficulty with swallowing may   Goals of Care: Vinie Sill, NP from Palliative Care and I spoke at length (on 11/11) with family and patient concerning goals of care and his current medical status. When asked about aggressive measures (CPR, Intubation) vs comfort care, patient states that he "doesn't mind being hooked up to a machine" and " I can make my own decision." He does state he would like his family to call his pastor to ask for prayers. His family seems to understand patient's current status and poor prospects for significant improvement and are leaning towards comfort care, but understand patient's wishes and agree that it is his choice. -Thank you to Palliative team for your help -Full Code status -Continue conversation with patient and family  Dispo: Disposition is deferred at this time, awaiting improvement of current medical problems.     The patient does have transportation limitations that hinder transportation to clinic appointments.    LOS: 2 days   Zada Finders, MD 02/07/2015, 1:49 PM

## 2015-02-07 NOTE — Consult Note (Signed)
Nephrology Consult Note  Patient name: Zachary Leonard Medical record number: [id number] Date of birth: 03/04/1932 Age: 81 y.o. Gender: male  Primary Care Provider: PROVIDER NOT IN SYSTEM  Pt Overview and Major Events to Date:  11/9: Pt. Admitted with Sepsis thought to be 2/2 UTI, Hypotension, dehydration, AKI on CKD II, Afib RVR, and subsequently found to have LLE DVT. Improved blood pressures with 4L volume resuscitation. HR controlled. No CT to evaluate for PE given AKI. Started on broad spectrum antibiotics.  11/10 11/11: Continued on Broad Spectrum Antibiotics, Cultures negative, Hypertension, Heart Rate controlled, Anticoagulated for DVT, Creatinine continues to rise with poor urine output.    Pt. Is a 81 y/o M with hx of Prostate Cancer s/p Radiation, CKD II, NSTEMI, CVA, HTN, recent Rhabdo. Initially presented with dyspnea, found to be in afib with RVR, Hypotension bp's 81/60, Lactic acidosis, O2 requirement. Today, pt. Says that he is thirsty and would REALLY like something to drink. He had a CVA 3 months ago, which impaired his ability to swallow. He is supposed to be on thickened liquids as an outpatient, but his family says that he does not like them and has not been drinking them at his facility. He had to be admitted to [facility] recently for dehydration per the family, so they transferred him from the facility in [place] to [place] where they could keep a closer eye on him. Per the family he acutely developed this SOB at his facility on 11/9, and he was brought to the ED.   In the ED he was given fluids, was started on broad spectrum antibiotics, and cardizem for heart rate which was easily controlled. He was admitted to the step down unit. He has since been receiving fluids, but they are undocumented, and it is unclear exactly how much he has gotten. However, It is > 4L. He has continued to have very little urine output. His hypotension is resolved and he is now hypertensive. He has had  labile temperatures with intermittent hypothermia. Blood cultures have been negative, and he continues to receive broad spectrum antibiotics. Lactic Acid is nearly cleared. He is on Heparin for treatment of DVT.   Assessment and Plan: Essentially 81 y/o M here with Oliguric AKI on CKD II in the setting of sepsis, initial hypotension, on ACE- I at home, and hx of prostate cancer s/p RT, Sepsis resolved, Afib with rvr now with rate control.   AKI on CKDII - Oliguric ATN 2/2 prerenal syndrome. All likely due to sepsis, hypotension while on ACE- I at home. Oliguria in the setting of hx of prostate cancer s/p RT. Cr slowly trending up 1.16 at baseline 2.29 > 2.9 here. BUN slowly climbing as well. U/A with granular casts, CK 47,  BP's improved. Bladder scan with only 75cc?.  - Place foley catheter to rule out obstruction contributing.   - Continue gentle IVF. He has dependent edema to exam. Decreased rate to 50cc/hr D5 0.45%NS.  - Daily BMET - Strict I/O's, Weights - Ultrasound already ordered.  - Holding nephrotoxic agents. Avoid contrast if possible.  - Will need plan for hydration going home if he is unable to take po.  - BP's have stabilized. Return of anti-HTN agents judiciously.   Sepsis - thought to be 2/2 urinary source on admission. On broad spectrum antibiotics. Initially with lactic acidosis. Improved. No longer septic.  - Continue management per primary team.   LLE DVT - On heparin. Further mgmt per primary team.   Afib  RVR vs MAT - Continues in Afib. Rate controlled at this time without intervention.  - Mgmt per primary team.   Hyperkalemia - Mild.  - Trend K.   Transaminitis - LFT's trending up. Most likely due to transient hepatic ischemia.  - Continue to follow.  - Volume resuscitated. Follow LFT's.  - Further evaluation per primary team. Ultrasound ordered.   Disposition: Rehydration. Will follow kidney function. Needs plan for nutrition and hydration as an outpatient.    Objective: Temp:  [96.5 F (35.8 C)-98.5 F (36.9 C)] 96.8 F (36 C) (11/11 1200) Pulse Rate:  [30-88] 49 (11/11 1200) Resp:  [5-27] 5 (11/11 1200) BP: (122-156)/(75-126) 151/75 mmHg (11/11 1200) SpO2:  [94 %-100 %] 97 % (11/11 1200) Weight:  [164 lb (74.39 kg)] 164 lb (74.39 kg) (11/11 0500) Physical Exam: General: NAD, resting comfortably in bed. Slow to respond, but AAOx3 Cardiovascular: Irregularaly Irregular, appropriate rate, no murmurs.  Respiratory: CTA bilaterally, no audible crackles. Appropriate rate, unlabored.  Abdomen: S, NT, ND, +Bs, no suprapubic fullness.  Extremities: WWP, 1-2+ edema of LLE, Dependent edema of hips bilaterally.   Laboratory:  Recent Labs Lab 02/05/15 0506 02/05/15 0518 02/06/15 0355 02/07/15 0622  WBC 11.4*  --  18.8* 17.5*  HGB 12.1* 14.6 11.6* 11.6*  HCT 37.7* 43.0 36.6* 35.4*  PLT 229  --  232 171    Recent Labs Lab 02/05/15 0506 02/05/15 0518 02/06/15 0355 02/07/15 0622  NA 144 142 146* 146*  K 4.2 4.1 4.1 5.2*  CL 105 108 111 115*  CO2 19*  --  20* 17*  BUN 21* 28* 28* 41*  CREATININE 2.29* 2.10* 2.79* 2.90*  CALCIUM 8.7*  --  7.4* 7.4*  PROT 5.8*  --  6.1* 5.6*  BILITOT 1.4*  --  1.5* 1.9*  ALKPHOS 111  --  222* 315*  ALT 66*  --  92* 185*  AST 136*  --  89* 147*  GLUCOSE 142* 136* 121* 96      Aquilla Hacker, MD 02/07/2015, 2:21 PM PGY-2

## 2015-02-07 NOTE — Progress Notes (Signed)
Reported to Teaching Service lactic acid of 2.2 no  New orders.

## 2015-02-08 LAB — CBC
HCT: 33.7 % — ABNORMAL LOW (ref 39.0–52.0)
Hemoglobin: 10.8 g/dL — ABNORMAL LOW (ref 13.0–17.0)
MCH: 30.2 pg (ref 26.0–34.0)
MCHC: 32 g/dL (ref 30.0–36.0)
MCV: 94.1 fL (ref 78.0–100.0)
PLATELETS: 188 10*3/uL (ref 150–400)
RBC: 3.58 MIL/uL — AB (ref 4.22–5.81)
RDW: 15 % (ref 11.5–15.5)
WBC: 15.9 10*3/uL — ABNORMAL HIGH (ref 4.0–10.5)

## 2015-02-08 LAB — RENAL FUNCTION PANEL
ALBUMIN: 2 g/dL — AB (ref 3.5–5.0)
ANION GAP: 13 (ref 5–15)
BUN: 41 mg/dL — ABNORMAL HIGH (ref 6–20)
CALCIUM: 7.6 mg/dL — AB (ref 8.9–10.3)
CO2: 18 mmol/L — ABNORMAL LOW (ref 22–32)
CREATININE: 2.84 mg/dL — AB (ref 0.61–1.24)
Chloride: 115 mmol/L — ABNORMAL HIGH (ref 101–111)
GFR calc non Af Amer: 19 mL/min — ABNORMAL LOW (ref >60)
GFR, EST AFRICAN AMERICAN: 22 mL/min — AB (ref >60)
GLUCOSE: 110 mg/dL — AB (ref 65–99)
PHOSPHORUS: 4 mg/dL (ref 2.5–4.6)
Potassium: 3.4 mmol/L — ABNORMAL LOW (ref 3.5–5.1)
SODIUM: 146 mmol/L — AB (ref 135–145)

## 2015-02-08 LAB — LACTIC ACID, PLASMA: Lactic Acid, Venous: 2.1 mmol/L (ref 0.5–2.0)

## 2015-02-08 LAB — HEPARIN LEVEL (UNFRACTIONATED): Heparin Unfractionated: 0.43 IU/mL (ref 0.30–0.70)

## 2015-02-08 MED ORDER — DEXTROSE-NACL 5-0.45 % IV SOLN
INTRAVENOUS | Status: AC
  Administered 2015-02-08: 08:00:00 via INTRAVENOUS

## 2015-02-08 MED ORDER — DILTIAZEM HCL 100 MG IV SOLR
5.0000 mg/h | INTRAVENOUS | Status: DC
  Administered 2015-02-08 – 2015-02-12 (×4): 5 mg/h via INTRAVENOUS
  Filled 2015-02-08 (×7): qty 100

## 2015-02-08 MED ORDER — DEXTROSE 5 % IV SOLN
1.0000 g | INTRAVENOUS | Status: AC
  Administered 2015-02-08 – 2015-02-11 (×4): 1 g via INTRAVENOUS
  Filled 2015-02-08 (×4): qty 10

## 2015-02-08 MED ORDER — POTASSIUM CHLORIDE 10 MEQ/100ML IV SOLN
10.0000 meq | INTRAVENOUS | Status: AC
  Administered 2015-02-08 (×4): 10 meq via INTRAVENOUS
  Filled 2015-02-08 (×4): qty 100

## 2015-02-08 NOTE — Progress Notes (Signed)
Pt's HR 130s-140s, currently 146. Otherwise HDS. Paged MD for med order to rate control. Will start dilt and continue to monitor.

## 2015-02-08 NOTE — Progress Notes (Signed)
ANTICOAGULATION and ANTIBIOTIC CONSULT NOTE - Follow Up Consult  Pharmacy Consult for Heparin and Rocephin Indication: DVT and UTI  No Known Allergies  Patient Measurements: Height: 5\' 7"  (170.2 cm) (estimated) Weight: 161 lb (73.029 kg) IBW/kg (Calculated) : 66.1  Vital Signs: Temp: 97.4 F (36.3 C) (11/12 0757) Temp Source: Oral (11/12 0757) BP: 115/89 mmHg (11/12 0757) Pulse Rate: 67 (11/12 0757)  Labs:  Recent Labs  02/05/15 2032  02/06/15 0355 02/06/15 1052  02/06/15 2320 02/07/15 0622 02/08/15 0348  HGB  --   < > 11.6*  --   --   --  11.6* 10.8*  HCT  --   --  36.6*  --   --   --  35.4* 33.7*  PLT  --   --  232  --   --   --  171 188  HEPARINUNFRC  --   --  1.03*  --   < > 0.59 0.67 0.43  CREATININE  --   --  2.79*  --   --   --  2.90* 2.84*  TROPONINI 1.67*  --  1.61* 0.96*  --   --   --   --   < > = values in this interval not displayed.  Estimated Creatinine Clearance: 19.1 mL/min (by C-G formula based on Cr of 2.84).   Assessment: 81 yo M admitted 02/05/2015 with new L LE DVT on heparin. Heparin level this morning was therapeutic (HL 0.43, goal of 0.3-0.7). Hgb down a little to 10.8. plts 188. No s/sx of bleeding noted. Considering PEG tube so not ready to transition to enteral anticoagulation yet.  Vanc/Zosyn dc 11/12, Start CTX for UTI. Presented w/ hypotension, resp distress, hypothermia. Low temps, WBC 15.9 coming down; . LA 2.1  (peaked at 8.76) CXR neg for PNA but dry; Ucx neg but presumed source of sepsis.    Vanc 11/9 >>11/12 Zosyn 11/9 >>11/12 CTX 11/12>>  11/9 BCx2 > ngtd 11/0 Urine cx > ngF  Goal of Therapy:  Heparin level 0.3-0.7 units/ml Monitor platelets by anticoagulation protocol: Yes   Plan:  -Rocephin 1 gm q24 for UTI coverage  -Continue heparin at 800 units/hr   -Will continue to monitor for any signs/symptoms of bleeding and will follow up with heparin level and CBC in the a.m.  Eudelia Bunch, Pharm.D. [id number] 02/08/2015  10:45 AM

## 2015-02-08 NOTE — Progress Notes (Signed)
Subjective: Patient has no active complaints including no chest pain, sob, n/v or pain anywhere else. Talked to him in the presence of his good friend in the room. He discussed need for nutrition and discussed risk of aspiration with PO nutrition, discussed that if he wants to pursue feeding tube that there would still be risk of aspiration even with feeding tube. Asked him to think about this and to let us know.  Objective: Vital signs in last 24 hours: Filed Vitals:   02/08/15 0500 02/08/15 0540 02/08/15 0700 02/08/15 0757  BP:   133/105 115/89  Pulse:   41 67  Temp:  97.4 F (36.3 C)  97.4 F (36.3 C)  TempSrc:  Axillary  Oral  Resp:   11 27  Height:      Weight: 161 lb (73.029 kg)     SpO2:   95% 98%   Weight change: -3 lb (-1.361 kg)  Intake/Output Summary (Last 24 hours) at 02/08/15 0848 Last data filed at 02/08/15 0600  Gross per 24 hour  Intake    396 ml  Output    450 ml  Net    -54 ml   General: resting in bed Cardiac: irregularly irregular, no murmurs heard Pulm: clear to auscultation anteriorly Abd: soft, nontender, nondistended, BS present GU: foley in place.  Ext: no pedal edema, SCDs on RLE Neuro: alert and oriented, slow to respond but does answer questions. Has left sided weakness 2/2 to prior CVA. EOM intact, but does not follow command to follow finger with eyes only.   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   Sepsis: likely 2/2 to UTI however ucx remains negative. 11.4 --> 18.8 >>15.9.  Currently on Vanc and Zosyn. UA positive for nitrites and small leukocytes pending culture, which is presumed source of infection.  - d/c vanc. - switch zosyn to ceftriaxone to narrow down abx coverage for possibly UTI -monitor vitals -f/u blood cultures, urine culture --> NGTD -cont d5 1/2 normal  Oliguric AKI: likely ATN from sepsis causing hypotension, was on AceI as well. SCr 2.29 -->2.79-->2.90 now plateued around 2.84.  UOP remains low but improving Renal u/s unremarkable. -Hold lisinopril and HCTZ -strict I/Os, monitor urine output - appreciate nephro consult   LLE DVT with possible PE - Occlusive DVT noted in left profunda femoral vein and left peroneal veins, no DVT RLE. No CT angio PE study was done because of AKI. TTE without evidence of right heart strain. Will not pursue CT angio at this time due to AKI and no change in management if PE identified. Repeat CXR on 11/10 with central pulmonary vascular prominence and cardiomegaly without consolidation which may indicate underlying PE. -Continue IV heparin for DVT treatment and also probable PE   Acute A. Fib w/ RVR vs. MAT: Patient noted to have irregularly irregular tachycardic heart rhythm and rate on arrival thought to be atrial fibrillation. EKG showing occasional p-waves with varying morphologies, suggesting a multifocal atrial tachycardia. Patient has been on Aspirin and Plavix at home for prior CVA -Hold  Aspirin & Plavix -Continue IV Heparin  Dysphagia: likely 2/2 to recent stroke Swallowing study yesterday showed significant risk for aspiration even with thin liquids. Patient asks for water, but we explained that his difficulty with swallowing may  - patient is trying to decide feeding tube vs no tube. Will likely pursue NGT feeds and then PEG tube in the future.   Hypokalemia - repletion PRN.  Goals of Care: Vinie Sill, NP from Palliative Care and I spoke at length (on 11/11) with family and patient concerning goals  of care and his current medical status. When asked about aggressive measures (CPR, Intubation) vs comfort care, patient states that he "doesn't mind being hooked up to a machine" and " I can make my own decision." He does state he would like his family to call his pastor to ask for prayers. His family seems to understand patient's current status and poor prospects for significant improvement and are leaning towards comfort care, but understand patient's wishes and agree that it is his choice. -Thank you to Palliative team for your help -Full Code status -Continue conversation with patient and family  Dispo: Disposition is deferred at this time, awaiting improvement of current medical problems.     The patient does have transportation limitations that hinder transportation to clinic appointments.    LOS: 3 days   Dellia Nims, MD 02/08/2015, 8:48 AM

## 2015-02-08 NOTE — Progress Notes (Signed)
Subjective:  Of note- nephrology consult note 11/11 signed by wrong MD before I got to it.  Renal ultrasound unremarkable for obstruction- UOP better- creatinine trending down. Wants to drink water Objective Vital signs in last 24 hours: Filed Vitals:   02/08/15 0500 02/08/15 0540 02/08/15 0700 02/08/15 0757  BP:   133/105 115/89  Pulse:   41 67  Temp:  97.4 F (36.3 C)  97.4 F (36.3 C)  TempSrc:  Axillary  Oral  Resp:   11 27  Height:      Weight: 73.029 kg (161 lb)     SpO2:   95% 98%   Weight change: -1.361 kg (-3 lb)  Intake/Output Summary (Last 24 hours) at 02/08/15 0844 Last data filed at 02/08/15 0600  Gross per 24 hour  Intake    396 ml  Output    450 ml  Net    -54 ml    Assessment/ Plan: Pt is a 81 y.o. yo male chronically ill but without any CKD who was admitted on 02/05/2015 with hypotension/Afib/volue depletion and A on CRF  Assessment/Plan: 1. Renal- A on CRF- all signs are point to ATN from hypotension/vol depletion/Afib on an ACE- U/A with granular casts, U/S unremarkable.  Now with foley catheter placement can see that he is making some urine and creatinine is trending down.  Given normal renal function on 10/22 I have a good feeling about eventual renal recovery  2. HTN/vol- BP marginal- no BP meds- I agree with some IVF especially since is NPO for dysphagia issues 3. Hypernatremia- 1/2 NS- is stable 4. Anemia- mild and multifactorial- supportive care   Esty Ahuja A    Labs: Basic Metabolic Panel:  Recent Labs Lab 02/05/15 1430 02/06/15 0355 02/07/15 0622 02/08/15 0348  NA  --  146* 146* 146*  K  --  4.1 5.2* 3.4*  CL  --  111 115* 115*  CO2  --  20* 17* 18*  GLUCOSE  --  121* 96 110*  BUN  --  28* 41* 41*  CREATININE  --  2.79* 2.90* 2.84*  CALCIUM  --  7.4* 7.4* 7.6*  PHOS 5.9*  --   --  4.0   Liver Function Tests:  Recent Labs Lab 02/05/15 0506 02/06/15 0355 02/07/15 0622 02/08/15 0348  AST 136* 89* 147*  --   ALT 66* 92*  185*  --   ALKPHOS 111 222* 315*  --   BILITOT 1.4* 1.5* 1.9*  --   PROT 5.8* 6.1* 5.6*  --   ALBUMIN 2.2* 2.1* 2.0* 2.0*   No results for input(s): LIPASE, AMYLASE in the last 168 hours. No results for input(s): AMMONIA in the last 168 hours. CBC:  Recent Labs Lab 02/05/15 0506  02/06/15 0355 02/07/15 0622 02/08/15 0348  WBC 11.4*  --  18.8* 17.5* 15.9*  NEUTROABS 8.4*  --   --   --   --   HGB 12.1*  < > 11.6* 11.6* 10.8*  HCT 37.7*  < > 36.6* 35.4* 33.7*  MCV 95.9  --  97.6 94.9 94.1  PLT 229  --  232 171 188  < > = values in this interval not displayed. Cardiac Enzymes:  Recent Labs Lab 02/05/15 0506 02/05/15 1430 02/05/15 2032 02/06/15 0355 02/06/15 1052  CKTOTAL 47*  --   --   --   --   TROPONINI  --  1.36*  1.27* 1.67* 1.61* 0.96*   CBG:  Recent Labs Lab 02/05/15 1952 02/06/15  0310 02/06/15 2313  GLUCAP 88 86 98    Iron Studies: No results for input(s): IRON, TIBC, TRANSFERRIN, FERRITIN in the last 72 hours. Studies/Results: US Abdomen Complete  02/07/2015  CLINICAL DATA:  Elevated liver function tests EXAM: ULTRASOUND ABDOMEN COMPLETE COMPARISON:  06/07/2009 abdominal CT FINDINGS: Gallbladder: No gallstones or wall thickening visualized. No sonographic Murphy sign noted. Common bile duct: Diameter: 3 mm where visualized Liver: No focal lesion identified. Within normal limits in parenchymal echogenicity. IVC: Limited visualization due to bowel gas.  No pathologic finding. Pancreas: Visualized portion unremarkable. Spleen: Size and appearance within normal limits. Right Kidney: Length: 10 cm. Echogenicity within normal limits. No mass or hydronephrosis visualized. Left Kidney: Length: 10 cm. Echogenicity within normal limits. No mass or hydronephrosis visualized. Abdominal aorta: No aneurysm visualized. Other findings: Right pleural effusion incidentally visualized. IMPRESSION: 1. No acute finding or explanation for liver function tests. 2. Right pleural  effusion. Electronically Signed   By: Monte Fantasia M.D.   On: 02/07/2015 21:43   Dg Swallowing Func-speech Pathology  02/06/2015  Objective Swallowing Evaluation:   MBS Patient Details Name: Zachary Leonard MRN: [id number] Date of Birth: [birth year] Today's Date: 02/06/2015 Time: SLP Start Time (ACUTE ONLY): 1320-SLP Stop Time (ACUTE ONLY): 1340 SLP Time Calculation (min) (ACUTE ONLY): 20 min Past Medical History: Past Medical History Diagnosis Date . Cancer [facility])    prostate, treated with XRT . HTN (hypertension)  Past Surgical History: Past Surgical History Procedure Laterality Date . No past surgeries   HPI: Zachary Leonard is a 81 y.o. male who has PMH of prostate cancer s/p radiation, CVA, HTN, rhabdomyolosis, AKI, cognitive impairment, and NSTEMI. He was seen by speech during recent admission 11/2014 and was started on a Dys 1 diet and nectar-thick liquids. He presented to the ED with sepsis of unclear origin. CXR showed low lung volumes but without PNA. MBS recommended after swallow treatment. Subjective: pt alert, minimally communicative but denies complaints Assessment / Plan / Recommendation CHL IP CLINICAL IMPRESSIONS 02/06/2015 Therapy Diagnosis Moderate oral phase dysphagia;Suspected primary esophageal dysphagia;Moderate pharyngeal phase dysphagia;Severe pharyngeal phase dysphagia Clinical Impression Pt exhibited a moderate oral dysphagia with delayed transit and lingual residue.  Suspect primary esophageal dysphagia with evidence of cricopharyngeal bar and poor clearance of esophagram (full of barium stasis at end of study). Decreased sensation led to swallow initiation to the pyriform sinuses (filling pyriforms prior to swallow initation). Moderate aspiration, sensed with cough, during the swallow with thin liquids. Pt's aspiration risk is significant due to increased respiratory effort (RR up to 47 x 1 during study), poor endurance, fatigue and poor reserve. Consumption of any po's will likely lead to  eventual aspiration. Discussed results with pt, Dr. Posey Pronto, Palliative care RN Elmo Putt) and pt's niece Kennyth Lose). Discussions regarding wishes in regards to po's (pt verbalized his desire for "water") recommended by MD to pt/family.       CHL IP TREATMENT RECOMMENDATION 02/05/2015 Treatment Recommendations Therapy as outlined in treatment plan below   CHL IP DIET RECOMMENDATION 02/06/2015 SLP Diet Recommendations NPO Liquid Administration via -- Medication Administration -- Compensations -- Postural Changes --   CHL IP OTHER RECOMMENDATIONS 02/06/2015 Recommended Consults -- Oral Care Recommendations Oral care QID Other Recommendations --   CHL IP FOLLOW UP RECOMMENDATIONS 02/06/2015 Follow up Recommendations Skilled Nursing facility   [facility] IP FREQUENCY AND DURATION 02/05/2015 Speech Therapy Frequency (ACUTE ONLY) min 2x/week Treatment Duration 2 weeks      CHL IP ORAL PHASE 02/06/2015 Oral Phase Impaired Oral -  Pudding Teaspoon -- Oral - Pudding Cup -- Oral - Honey Teaspoon Delayed oral transit;Lingual/palatal residue;Weak lingual manipulation Oral - Honey Cup -- Oral - Nectar Teaspoon Delayed oral transit;Lingual/palatal residue;Weak lingual manipulation Oral - Nectar Cup Delayed oral transit;Lingual/palatal residue;Weak lingual manipulation Oral - Nectar Straw -- Oral - Thin Teaspoon Delayed oral transit;Lingual/palatal residue;Weak lingual manipulation Oral - Thin Cup Delayed oral transit;Lingual/palatal residue;Weak lingual manipulation Oral - Thin Straw -- Oral - Puree -- Oral - Mech Soft -- Oral - Regular -- Oral - Multi-Consistency -- Oral - Pill -- Oral Phase - Comment --  CHL IP PHARYNGEAL PHASE 02/06/2015 Pharyngeal Phase Honey;Thin Pharyngeal- Pudding Teaspoon -- Pharyngeal -- Pharyngeal- Pudding Cup -- Pharyngeal -- Pharyngeal- Honey Teaspoon Delayed swallow initiation-vallecula Pharyngeal -- Pharyngeal- Honey Cup NT Pharyngeal -- Pharyngeal- Nectar Teaspoon Delayed swallow initiation-pyriform sinuses  Pharyngeal -- Pharyngeal- Nectar Cup Delayed swallow initiation-vallecula Pharyngeal -- Pharyngeal- Nectar Straw NT Pharyngeal -- Pharyngeal- Thin Teaspoon Delayed swallow initiation-pyriform sinuses Pharyngeal -- Pharyngeal- Thin Cup Pharyngeal residue - pyriform;Penetration/Aspiration during swallow Pharyngeal Material enters airway, passes BELOW cords and not ejected out despite cough attempt by patient Pharyngeal- Thin Straw NT Pharyngeal -- Pharyngeal- Puree -- Pharyngeal -- Pharyngeal- Mechanical Soft -- Pharyngeal -- Pharyngeal- Regular -- Pharyngeal -- Pharyngeal- Multi-consistency -- Pharyngeal -- Pharyngeal- Pill -- Pharyngeal -- Pharyngeal Comment --  CHL IP CERVICAL ESOPHAGEAL PHASE 02/06/2015 Cervical Esophageal Phase Impaired Pudding Teaspoon -- Pudding Cup -- Honey Teaspoon -- Honey Cup -- Nectar Teaspoon -- Nectar Cup -- Nectar Straw -- Thin Teaspoon -- Thin Cup -- Thin Straw -- Puree -- Mechanical Soft -- Regular -- Multi-consistency -- Pill -- Cervical Esophageal Comment (No Data) [place] Siren 02/06/2015, 3:33 PM [name] Litaker M.Ed CCC-SLP Pager [phone]              Medications: Infusions: . dextrose 5 % and 0.45% NaCl 75 mL/hr at 02/08/15 0753  . heparin 800 Units/hr (02/08/15 0113)    Scheduled Medications: . multivitamin with minerals  1 tablet Oral Daily  . piperacillin-tazobactam (ZOSYN)  IV  2.25 g Intravenous Q8H  . vancomycin  1,000 mg Intravenous Q48H    have reviewed scheduled and prn medications.  Physical Exam: General: chronically ill BM- asking for water Heart: irreg Lungs: mostly clear Abdomen: soft, non tender Extremities: dependent edema    02/08/2015,8:44 AM  LOS: 3 days

## 2015-02-09 LAB — RENAL FUNCTION PANEL
ALBUMIN: 2 g/dL — AB (ref 3.5–5.0)
Anion gap: 10 (ref 5–15)
BUN: 37 mg/dL — AB (ref 6–20)
CALCIUM: 7.9 mg/dL — AB (ref 8.9–10.3)
CO2: 20 mmol/L — ABNORMAL LOW (ref 22–32)
CREATININE: 2.59 mg/dL — AB (ref 0.61–1.24)
Chloride: 115 mmol/L — ABNORMAL HIGH (ref 101–111)
GFR, EST AFRICAN AMERICAN: 25 mL/min — AB (ref >60)
GFR, EST NON AFRICAN AMERICAN: 22 mL/min — AB (ref >60)
Glucose, Bld: 148 mg/dL — ABNORMAL HIGH (ref 65–99)
Phosphorus: 2.9 mg/dL (ref 2.5–4.6)
Potassium: 3.2 mmol/L — ABNORMAL LOW (ref 3.5–5.1)
SODIUM: 145 mmol/L (ref 135–145)

## 2015-02-09 LAB — CBC
HCT: 32.7 % — ABNORMAL LOW (ref 39.0–52.0)
HEMOGLOBIN: 10.6 g/dL — AB (ref 13.0–17.0)
MCH: 30.6 pg (ref 26.0–34.0)
MCHC: 32.4 g/dL (ref 30.0–36.0)
MCV: 94.5 fL (ref 78.0–100.0)
Platelets: 183 10*3/uL (ref 150–400)
RBC: 3.46 MIL/uL — AB (ref 4.22–5.81)
RDW: 15.3 % (ref 11.5–15.5)
WBC: 13.8 10*3/uL — AB (ref 4.0–10.5)

## 2015-02-09 LAB — HEPARIN LEVEL (UNFRACTIONATED): HEPARIN UNFRACTIONATED: 0.42 [IU]/mL (ref 0.30–0.70)

## 2015-02-09 MED ORDER — WHITE PETROLATUM GEL
Status: AC
  Administered 2015-02-09: 0.2
  Filled 2015-02-09: qty 1

## 2015-02-09 MED ORDER — CETYLPYRIDINIUM CHLORIDE 0.05 % MT LIQD
7.0000 mL | Freq: Two times a day (BID) | OROMUCOSAL | Status: DC
  Administered 2015-02-09 – 2015-02-12 (×5): 7 mL via OROMUCOSAL

## 2015-02-09 MED ORDER — DEXTROSE-NACL 5-0.45 % IV SOLN
INTRAVENOUS | Status: AC
  Administered 2015-02-09: 75 mL/h via INTRAVENOUS

## 2015-02-09 MED ORDER — POTASSIUM CHLORIDE 10 MEQ/100ML IV SOLN
10.0000 meq | INTRAVENOUS | Status: AC
  Administered 2015-02-09 (×4): 10 meq via INTRAVENOUS
  Filled 2015-02-09 (×5): qty 100

## 2015-02-09 MED ORDER — POTASSIUM CHLORIDE 10 MEQ/100ML IV SOLN: 10.0000 meq | INTRAVENOUS | Status: DC

## 2015-02-09 MED ORDER — CHLORHEXIDINE GLUCONATE 0.12 % MT SOLN
15.0000 mL | Freq: Two times a day (BID) | OROMUCOSAL | Status: DC
  Administered 2015-02-09 – 2015-02-13 (×8): 15 mL via OROMUCOSAL
  Filled 2015-02-09 (×4): qty 15

## 2015-02-09 NOTE — Progress Notes (Signed)
Subjective: Patient says he feels "alright" this morning, improved from yesterday. We talked about the possibility of pursuing feeding tubes due to his difficulty swallowing and high risk for aspiration, but also advised that there would still be risk for aspiration with these as well. He says he does not want the tube going down his nose and will think about the PEG tube, though he is understandably concerned about having to be "cut."   Objective: Vital signs in last 24 hours: Filed Vitals:   02/09/15 0000 02/09/15 0402 02/09/15 0545 02/09/15 0600  BP: 120/74 108/75  117/77  Pulse: 88 87  71  Temp: 97.3 F (36.3 C) 97.4 F (36.3 C)    TempSrc: Axillary Oral    Resp: 19 25  0  Height:      Weight:   163 lb (73.936 kg)   SpO2: 98% 90%  96%   Weight change: 2 lb (0.907 kg)  Intake/Output Summary (Last 24 hours) at 02/09/15 0842 Last data filed at 02/09/15 0600  Gross per 24 hour  Intake 1897.17 ml  Output   1100 ml  Net 797.17 ml   General: resting in bed Cardiac: irregularly irregular, no murmurs heard Pulm: clear to auscultation anteriorly Abd: soft, nontender, nondistended, BS present GU: foley in place.  Ext: no pedal edema Neuro: alert and oriented, more verbal and responsive compared to 2 days ago   Assessment/Plan: Principal Problem:   Sepsis ([place]) Active Problems:   Elevated troponin   AKI (acute kidney injury) ([place])   History of CVA (cerebrovascular accident)   Memory deficit after cerebral infarction   Personal history of prostate cancer   Acute kidney injury ([place])   Palliative care encounter   Dysphagia  81 y/o male with PMH of Prostate CA s/p XRT, HTN, CVA, and Dysphagia who presented with hypotension, respiratory distress, hypothermia, and tachycardic irregularly irregular rhythm.   Sepsis: likely 2/2 to UTI however ucx remains negative. WBC 11.4 --> 18.8 >>15.9-->13.8.  Originally on [place] from 11/9 - 11/12 when switched to Ceftriaxone as  blood cultures and urine culture negative. -Continue Ceftriaxone -monitor vitals -f/u blood cultures, urine culture --> NGTD  Oliguric AKI: likely ATN from sepsis causing hypotension, was on Ace-I as well.  SCr 2.29 -->2.79-->2.90, now trending down at 2.59 this morning.  UOP of 1100 mLs yesterday. -Hold lisinopril and HCTZ -strict I/Os, monitor urine output - appreciate nephro consult   LLE DVT with possible PE - Occlusive DVT noted in left profunda femoral vein and left peroneal veins, no DVT RLE. No CT angio PE study was done because of AKI. TTE without evidence of right heart strain. Will not pursue CT angio at this time due to AKI and no change in management if PE identified. Repeat CXR on 11/10 with central pulmonary vascular prominence and cardiomegaly without consolidation which may indicate underlying PE. -Continue IV heparin for DVT treatment and also probable PE   Acute A. Fib w/ RVR: Patient noted to be in A. Fib with RVR yesterday (11/12) with HR in 130s-140s. Started on Diltiazem gtt. Rate controlled <110 currently and blood pressures stable. -Hold Aspirin & Plavix -Continue IV Heparin -Continue Diltiazem gtt  Dysphagia: likely 2/2 to recent stroke. Swallowing study on 11/10 showed significant risk for aspiration even with thin liquids. He is more alert and responsive compared to 2 days ago, hopefully his swallowing function has showed some improvement as well. Will ask speech to reassess and follow up on recommendations. -Patient  is trying to decide feeding tube vs no tube. He does not want NGT feeds, will think about PEG tube in the future. -Appreciate Speech Pathology recommendations  Hypokalemia - repletion PRN.  Goals of Care: Vinie Sill, NP from Palliative Care and I spoke at length (on 11/11) with family and patient concerning goals of care and his current medical status. When asked about aggressive measures (CPR, Intubation) vs comfort care, patient states that he  "doesn't mind being hooked up to a machine" and " I can make my own decision." He does state he would like his family to call his pastor to ask for prayers. His family seems to understand patient's current status and poor prospects for significant improvement and are leaning towards comfort care, but understand patient's wishes and agree that it is his choice. -Thank you to Palliative team for your help -Full Code status -Continue conversation with patient and family  Dispo: Disposition is deferred at this time, awaiting improvement of current medical problems.     The patient does have transportation limitations that hinder transportation to clinic appointments.    LOS: 4 days   Zada Finders, MD 02/09/2015, 8:42 AM

## 2015-02-09 NOTE — Progress Notes (Signed)
Subjective:  Of note- nephrology consult note 11/11 signed by wrong MD before I got to it. Had some Afib with RVR overnight- now better on dilt- making good urine- creatinine down again slightly   Objective Vital signs in last 24 hours: Filed Vitals:   02/09/15 0000 02/09/15 0402 02/09/15 0545 02/09/15 0600  BP: 120/74 108/75  117/77  Pulse: 88 87  71  Temp: 97.3 F (36.3 C) 97.4 F (36.3 C)    TempSrc: Axillary Oral    Resp: 19 25  0  Height:      Weight:   73.936 kg (163 lb)   SpO2: 98% 90%  96%   Weight change: 0.907 kg (2 lb)  Intake/Output Summary (Last 24 hours) at 02/09/15 [id number] Last data filed at 02/09/15 0600  Gross per 24 hour  Intake 1897.17 ml  Output   1100 ml  Net 797.17 ml    Assessment/ Plan: Pt is a 81 y.o. yo male chronically ill but without any CKD who was admitted on 02/05/2015 with hypotension/Afib/volue depletion and A on CRF  Assessment/Plan: 1. Renal- A on CRF- all signs are point to ATN from hypotension/vol depletion/Afib on an ACE- U/A with granular casts, U/S unremarkable.  Now with foley catheter placement can see that he is making some urine and creatinine is trending down.  Given relatively  normal renal function on 10/22 I have a good feeling about eventual renal recovery  2. HTN/vol- BP marginal- no BP meds- I agree with some IVF especially since is NPO for dysphagia issues 3. Hypernatremia- 1/2 NS- is stable to improved 4. Anemia- mild and multifactorial- supportive care 5. Hypokalemia- being repleted   Cierah Crader A    Labs: Basic Metabolic Panel:  Recent Labs Lab 02/05/15 1430  02/07/15 0622 02/08/15 0348 02/09/15 0455  NA  --   < > 146* 146* 145  K  --   < > 5.2* 3.4* 3.2*  CL  --   < > 115* 115* 115*  CO2  --   < > 17* 18* 20*  GLUCOSE  --   < > 96 110* 148*  BUN  --   < > 41* 41* 37*  CREATININE  --   < > 2.90* 2.84* 2.59*  CALCIUM  --   < > 7.4* 7.6* 7.9*  PHOS 5.9*  --   --  4.0 2.9  < > = values in this interval  not displayed. Liver Function Tests:  Recent Labs Lab 02/05/15 0506 02/06/15 0355 02/07/15 0622 02/08/15 0348 02/09/15 0455  AST 136* 89* 147*  --   --   ALT 66* 92* 185*  --   --   ALKPHOS 111 222* 315*  --   --   BILITOT 1.4* 1.5* 1.9*  --   --   PROT 5.8* 6.1* 5.6*  --   --   ALBUMIN 2.2* 2.1* 2.0* 2.0* 2.0*   No results for input(s): LIPASE, AMYLASE in the last 168 hours. No results for input(s): AMMONIA in the last 168 hours. CBC:  Recent Labs Lab 02/05/15 0506  02/06/15 0355 02/07/15 0622 02/08/15 0348 02/09/15 0455  WBC 11.4*  --  18.8* 17.5* 15.9* 13.8*  NEUTROABS 8.4*  --   --   --   --   --   HGB 12.1*  < > 11.6* 11.6* 10.8* 10.6*  HCT 37.7*  < > 36.6* 35.4* 33.7* 32.7*  MCV 95.9  --  97.6 94.9 94.1 94.5  PLT 229  --  232 171 188 183  < > = values in this interval not displayed. Cardiac Enzymes:  Recent Labs Lab 02/05/15 0506 02/05/15 1430 02/05/15 2032 02/06/15 0355 02/06/15 1052  CKTOTAL 47*  --   --   --   --   TROPONINI  --  1.36*  1.27* 1.67* 1.61* 0.96*   CBG:  Recent Labs Lab 02/05/15 1952 02/06/15 0310 02/06/15 2313  GLUCAP 88 86 98    Iron Studies: No results for input(s): IRON, TIBC, TRANSFERRIN, FERRITIN in the last 72 hours. Studies/Results: US Abdomen Complete  02/07/2015  CLINICAL DATA:  Elevated liver function tests EXAM: ULTRASOUND ABDOMEN COMPLETE COMPARISON:  06/07/2009 abdominal CT FINDINGS: Gallbladder: No gallstones or wall thickening visualized. No sonographic Murphy sign noted. Common bile duct: Diameter: 3 mm where visualized Liver: No focal lesion identified. Within normal limits in parenchymal echogenicity. IVC: Limited visualization due to bowel gas.  No pathologic finding. Pancreas: Visualized portion unremarkable. Spleen: Size and appearance within normal limits. Right Kidney: Length: 10 cm. Echogenicity within normal limits. No mass or hydronephrosis visualized. Left Kidney: Length: 10 cm. Echogenicity within normal  limits. No mass or hydronephrosis visualized. Abdominal aorta: No aneurysm visualized. Other findings: Right pleural effusion incidentally visualized. IMPRESSION: 1. No acute finding or explanation for liver function tests. 2. Right pleural effusion. Electronically Signed   By: Monte Fantasia M.D.   On: 02/07/2015 21:43   Medications: Infusions: . diltiazem (CARDIZEM) infusion 5 mg/hr (02/08/15 2248)  . heparin 800 Units/hr (02/08/15 1500)    Scheduled Medications: . antiseptic oral rinse  7 mL Mouth Rinse q12n4p  . cefTRIAXone (ROCEPHIN)  IV  1 g Intravenous Q24H  . chlorhexidine  15 mL Mouth Rinse BID  . multivitamin with minerals  1 tablet Oral Daily  . potassium chloride  10 mEq Intravenous Q1 Hr x 4  . white petrolatum        have reviewed scheduled and prn medications.  Physical Exam: General: chronically ill BM- asking for water Heart: irreg Lungs: mostly clear Abdomen: soft, non tender Extremities: dependent edema    02/09/2015,8:38 AM  LOS: 4 days

## 2015-02-09 NOTE — Progress Notes (Signed)
ANTICOAGULATION CONSULT NOTE - Follow Up Consult  Pharmacy Consult for Heparin Indication: DVT   No Known Allergies  Patient Measurements: Height: 5\' 7"  (170.2 cm) (estimated) Weight: 163 lb (73.936 kg) IBW/kg (Calculated) : 66.1  Vital Signs: Temp: 97.7 F (36.5 C) (11/13 0858) Temp Source: Axillary (11/13 0858) BP: 119/79 mmHg (11/13 0858) Pulse Rate: 111 (11/13 0858)  Labs:  Recent Labs  02/07/15 0622 02/08/15 0348 02/09/15 0455  HGB 11.6* 10.8* 10.6*  HCT 35.4* 33.7* 32.7*  PLT 171 188 183  HEPARINUNFRC 0.67 0.43 0.42  CREATININE 2.90* 2.84* 2.59*    Estimated Creatinine Clearance: 20.9 mL/min (by C-G formula based on Cr of 2.59).   Assessment: 81 yo M admitted 02/05/2015 with new L LE DVT on heparin. Heparin level this morning is therapeutic (HL 0.42, goal of 0.3-0.7). Hgb 10.6 - stable. plts 183. . No s/sx of bleeding noted. Considering PEG tube so not ready to transition to enteral anticoagulation yet.  Goal of Therapy:  Heparin level 0.3-0.7 units/ml Monitor platelets by anticoagulation protocol: Yes   Plan:   -Continue heparin at 800 units/hr   -Will continue to monitor for any signs/symptoms of bleeding and will follow up with heparin level and CBC in the a.m.   Eudelia Bunch, Pharm.D. BP:[id number] 02/09/2015 11:41 AM

## 2015-02-09 NOTE — Progress Notes (Signed)
Speech Language Pathology Treatment: Dysphagia  Patient Details Name: Zachary Leonard MRN: [id number] DOB: [birth year] Today's Date: 02/09/2015 Time: [id number] SLP Time Calculation (min) (ACUTE ONLY): 15 min  Assessment / Plan / Recommendation Clinical Impression  Pt seen for f/u after MBS, providing education and ice chip trials. Pt had no recollection of having had swallowing testing, despite review of results from SLP. SLP provided education about current swallowing function, but when prompted for immediate recall of information, pt remains without any awareness of his dysphagia. SLP provided ice chip trials after oral care with change in respirations including increase in RR and drop in SpO2. Pt remains at a high aspiration risk both prandially and post-prandially. Would continue NPO, unless pt should opt for comfort feeds.   HPI HPI: Zachary Leonard is a 81 y.o. male who has PMH of prostate cancer s/p radiation, CVA, HTN, rhabdomyolosis, AKI, cognitive impairment, and NSTEMI. He was seen by speech during recent admission 11/2014 and was started on a Dys 1 diet and nectar-thick liquids. He presented to the ED with sepsis of unclear origin. CXR showed low lung volumes but without PNA. MBS recommended after swallow treatment.      SLP Plan  Continue with current plan of care     Recommendations  Diet recommendations: NPO Medication Administration: Via alternative means       Oral Care Recommendations: Oral care QID Follow up Recommendations: Skilled Nursing facility Plan: Continue with current plan of care   Germain Osgood, M.A. CCC-SLP [phone]  Germain Osgood 02/09/2015, 9:49 AM

## 2015-02-10 LAB — RENAL FUNCTION PANEL
ALBUMIN: 2.1 g/dL — AB (ref 3.5–5.0)
Anion gap: 10 (ref 5–15)
BUN: 30 mg/dL — AB (ref 6–20)
CO2: 19 mmol/L — ABNORMAL LOW (ref 22–32)
CREATININE: 2.22 mg/dL — AB (ref 0.61–1.24)
Calcium: 8.3 mg/dL — ABNORMAL LOW (ref 8.9–10.3)
Chloride: 115 mmol/L — ABNORMAL HIGH (ref 101–111)
GFR, EST AFRICAN AMERICAN: 30 mL/min — AB (ref >60)
GFR, EST NON AFRICAN AMERICAN: 26 mL/min — AB (ref >60)
Glucose, Bld: 108 mg/dL — ABNORMAL HIGH (ref 65–99)
PHOSPHORUS: 3.2 mg/dL (ref 2.5–4.6)
POTASSIUM: 3.8 mmol/L (ref 3.5–5.1)
Sodium: 144 mmol/L (ref 135–145)

## 2015-02-10 LAB — CULTURE, BLOOD (ROUTINE X 2)
CULTURE: NO GROWTH
CULTURE: NO GROWTH

## 2015-02-10 LAB — HEPARIN LEVEL (UNFRACTIONATED): Heparin Unfractionated: 0.57 IU/mL (ref 0.30–0.70)

## 2015-02-10 LAB — CBC
HCT: 30.4 % — ABNORMAL LOW (ref 39.0–52.0)
HEMOGLOBIN: 9.9 g/dL — AB (ref 13.0–17.0)
MCH: 30.9 pg (ref 26.0–34.0)
MCHC: 32.6 g/dL (ref 30.0–36.0)
MCV: 95 fL (ref 78.0–100.0)
PLATELETS: 181 10*3/uL (ref 150–400)
RBC: 3.2 MIL/uL — AB (ref 4.22–5.81)
RDW: 15.6 % — ABNORMAL HIGH (ref 11.5–15.5)
WBC: 14.3 10*3/uL — AB (ref 4.0–10.5)

## 2015-02-10 MED ORDER — DEXTROSE-NACL 5-0.45 % IV SOLN
INTRAVENOUS | Status: AC
  Administered 2015-02-10 (×2): via INTRAVENOUS

## 2015-02-10 NOTE — Progress Notes (Signed)
Subjective: Patient feels ok this morning, denies any pain. He is still thinking about the PEG tube vs comfort feeding and we will discuss again later today as he wants some more time to decide.   Objective: Vital signs in last 24 hours: Filed Vitals:   02/09/15 2348 02/10/15 0400 02/10/15 0500 02/10/15 0834  BP: 127/77 133/104  150/88  Pulse: 105 58  50  Temp: 98 F (36.7 C)   97.6 F (36.4 C)  TempSrc: Axillary   Oral  Resp: 14 8  15   Height:      Weight:   165 lb 8 oz (75.07 kg)   SpO2: 91% 90%  97%   Weight change: 2 lb 8 oz (1.134 kg)  Intake/Output Summary (Last 24 hours) at 02/10/15 1050 Last data filed at 02/10/15 0533  Gross per 24 hour  Intake  744.5 ml  Output   1150 ml  Net -405.5 ml   General: resting in bed Cardiac: irregularly irregular, no murmurs heard Pulm: clear to auscultation anteriorly Abd: soft, nontender, nondistended, BS present GU: foley in place.  Ext: +2 pitting edema of LEs, UE edema  Neuro: alert and oriented   Assessment/Plan: Principal Problem:   Sepsis ([place]) Active Problems:   Elevated troponin   AKI (acute kidney injury) ([place])   History of CVA (cerebrovascular accident)   Memory deficit after cerebral infarction   Personal history of prostate cancer   Acute kidney injury ([place])   Palliative care encounter   Dysphagia  81 y/o male with PMH of Prostate CA s/p XRT, HTN, CVA, and Dysphagia who presented with hypotension, respiratory distress, hypothermia, and tachycardic irregularly irregular rhythm.   Sepsis: likely 2/2 to UTI however ucx is negative. WBC 11.4 --> 18.8 >>15.9-->13.8.  AM labs not available today due to difficulty with blood draw. Originally on [place] from 11/9 - 11/12 when switched to Ceftriaxone as blood cultures and urine culture negative. -Continue Ceftriaxone -monitor vitals -f/u blood cultures, urine culture --> NGTD  Oliguric AKI: likely ATN from sepsis causing hypotension, was on Ace-I as well.    SCr trending down, but AM labs unavailable today.  UOP of 1150 mLs over last 24 hours. -Hold lisinopril and HCTZ -strict I/Os, monitor urine output -appreciate nephro consult   LLE DVT with possible PE - Occlusive DVT noted in left profunda femoral vein and left peroneal veins, no DVT RLE. No CT angio PE study was done because of AKI. TTE without evidence of right heart strain. Will not pursue CT angio at this time due to AKI and no change in management if PE identified. Repeat CXR on 11/10 with central pulmonary vascular prominence and cardiomegaly without consolidation which may indicate underlying PE. -Continue IV heparin for DVT treatment and also probable PE   Acute A. Fib w/ RVR: Patient noted to be in A. Fib with RVR on 11/12 with HR in 130s-140s. Started on Diltiazem gtt. Rate controlled <110 currently and blood pressures stable. -Hold Aspirin & Plavix -Continue IV Heparin -Continue Diltiazem gtt  Dysphagia: likely 2/2 to recent stroke. Swallowing study on 11/10 showed significant risk for aspiration even with thin liquids. Speech Pathology reassessed patient yesterday and recommend continued NPO status due to high aspiration risk. -Patient is trying to decide feeding tube vs no tube. He does not want NGT feeds, will think about PEG tube. Will continue to have discussions with patient about this. -Appreciate Speech Pathology recommendations  Hypokalemia - repletion PRN.  Goals of Care:  Vinie Sill, NP from Palliative Care and I spoke at length (on 11/11) with family and patient concerning goals of care and his current medical status. When asked about aggressive measures (CPR, Intubation) vs comfort care, patient states that he "doesn't mind being hooked up to a machine" and " I can make my own decision." He does state he would like his family to call his pastor to ask for prayers. His family seems to understand patient's current status and poor prospects for significant improvement  and are leaning towards comfort care, but understand patient's wishes and agree that it is his choice. -Thank you to Palliative team for your help -Full Code status -Continue conversation with patient and family  Dispo: Disposition is deferred at this time, awaiting improvement of current medical problems.     The patient does have transportation limitations that hinder transportation to clinic appointments.    LOS: 5 days   Zada Finders, MD 02/10/2015, 10:50 AM

## 2015-02-10 NOTE — Clinical Social Work Note (Signed)
Per report, patient admitted from [facility]. At this time, patient's discharge disposition deferred.  CSW will continue to follow and assist with discharge planning needs.  Lubertha Sayres, [facility]: [phone] Surgical: [phone]

## 2015-02-10 NOTE — Care Management Important Message (Signed)
Important Message  Patient Details  Name: Zachary Leonard MRN: [id number] Date of Birth: June 08, 1931   Medicare Important Message Given:  Yes    Zachary Leonard 02/10/2015, 1:26 PM

## 2015-02-10 NOTE — Progress Notes (Signed)
Patient seen and examined. Case d/w residents in detail. I agree with findings and plan as documented in Dr. Serita Grit note.  Patient feels ok today. No new complaints. Uncertain etiology of sepsis but slowly improving. Urine cx and blood cx with NGTD. Leukocytosis resolving.c/w ceftriaxone. Would complete 7 days abx total.  AKI now improving (likely ATN secondary to hypotension/sepsis). Continue to hold lisinopril and HCTZ. Urine output has improved.   C/w heparin and cardizem gtt for afib with RVR.  Patient also with LLE DVT and possible PE. Will c/w Heparin gtt.  Patient remains NPO and it is doubtful that he will be able to tolerate PO diet. He is unsure if he wants a PEG but high risk of aspiration with that explained to him. He refuses NG feeds. He is a full code for now. Will need to have further discussions with him to clarify his wishes.

## 2015-02-10 NOTE — Progress Notes (Signed)
ANTICOAGULATION CONSULT NOTE - Follow Up Consult  Pharmacy Consult for Heparin Indication: DVT  No Known Allergies  Patient Measurements: Height: 5\' 7"  (170.2 cm) (estimated) Weight: 165 lb 8 oz (75.07 kg) IBW/kg (Calculated) : 66.1 Heparin Dosing Weight:   Vital Signs: Temp: 97.6 F (36.4 C) (11/14 0834) Temp Source: Oral (11/14 0834) BP: 150/88 mmHg (11/14 0834) Pulse Rate: 50 (11/14 0834)  Labs:  Recent Labs  02/08/15 0348 02/09/15 0455 02/10/15 1130  HGB 10.8* 10.6* 9.9*  HCT 33.7* 32.7* 30.4*  PLT 188 183 181  HEPARINUNFRC 0.43 0.42 0.57  CREATININE 2.84* 2.59* 2.22*    Estimated Creatinine Clearance: 24.4 mL/min (by C-G formula based on Cr of 2.22).   Medications:  Scheduled:  . antiseptic oral rinse  7 mL Mouth Rinse q12n4p  . cefTRIAXone (ROCEPHIN)  IV  1 g Intravenous Q24H  . chlorhexidine  15 mL Mouth Rinse BID  . multivitamin with minerals  1 tablet Oral Daily    Assessment: 81yo male with LLE DVT.  HL up this AM to 0.57, though remains therapeutic.  Hg is ~stable and pltc wnl.  No bleeding noted.  Pt is still trying to decide on PEG tube.  Goal of Therapy:  Heparin level 0.3-0.7 units/ml Monitor platelets by anticoagulation protocol: Yes   Plan:  Decrease Heparin to 750 units/hr Watch for s/s of bleeding F/U in AM  Gracy Bruins, PharmD [facility]

## 2015-02-10 NOTE — Progress Notes (Signed)
Physical Therapy Treatment Patient Details Name: Gorje Albu MRN: [id number] DOB: 08-19-1931 Today's Date: 02/10/2015    History of Present Illness 81 y/o male with PMH of prostate cancer s/p RT, CVA, HTN, NSTEMI p/w worsening SOB from SNF. He was unable to provide a good history. He was recently discharged form [facility] for acute R MCA CVA with persistent left sided weakness. He presents to the ED by EMS from [facility] with dyspnea only able to speak in 1-2 word sentences and found to be in afib with RVR with lactic acidosis and hypotension    PT Comments    Pt admitted with above diagnosis. Pt currently with functional limitations due to balance and endurance deficits as well as left hemibody weakness.  Pt able to sit on EOB with less assist today than in previous session.  Fatigues quickly.  Continue PT as pt is able. Pt will benefit from skilled PT to increase their independence and safety with mobility to allow discharge to the venue listed below.    Follow Up Recommendations  SNF;Supervision/Assistance - 24 hour     Equipment Recommendations  Other (comment) (TBA)    Recommendations for Other Services       Precautions / Restrictions Precautions Precautions: Fall Restrictions Weight Bearing Restrictions: No LUE Weight Bearing: Weight bearing as tolerated LLE Weight Bearing: Weight bearing as tolerated    Mobility  Bed Mobility Overal bed mobility: Needs Assistance;+2 for physical assistance Bed Mobility: Rolling;Sidelying to Sit Rolling: Mod assist;+2 for physical assistance Sidelying to sit: Mod assist;+2 for physical assistance;HOB elevated       General bed mobility comments: Pt needed assist for bil LEs as well as for elevation of trunk.   Transfers                    Ambulation/Gait                 Stairs            Wheelchair Mobility    Modified Rankin (Stroke Patients Only)       Balance Overall balance assessment:  Needs assistance Sitting-balance support: Feet supported;Single extremity supported Sitting balance-Leahy Scale: Poor Sitting balance - Comments: Pt sat about 7 minutes on EOB with varying assist from max assist to min guard for brief seconds at a time.  Pt has difficulty finding center of balance at midline.  Needs cues and assist to maintain sitting.   Postural control: Right lateral lean;Posterior lean                          Cognition Arousal/Alertness: Awake/alert Behavior During Therapy: WFL for tasks assessed/performed Overall Cognitive Status: History of cognitive impairments - at baseline                      Exercises      General Comments General comments (skin integrity, edema, etc.): bil hands edematous, placed pillows under UEs       Pertinent Vitals/Pain Pain Assessment: No/denies pain  VSS    Home Living                      Prior Function            PT Goals (current goals can now be found in the care plan section) Progress towards PT goals: Progressing toward goals    Frequency  Min 2X/week    PT Plan  Current plan remains appropriate    Co-evaluation             End of Session   Activity Tolerance: Patient limited by fatigue Patient left: in bed;with call bell/phone within reach;with bed alarm set     Time: [id number] PT Time Calculation (min) (ACUTE ONLY): 11 min  Charges:  $Therapeutic Activity: 8-22 mins                    G CodesDenice Paradise 02-20-15, 1:48 PM [employer] Acute Rehabilitation [phone] (pager)

## 2015-02-10 NOTE — Progress Notes (Signed)
Daily Progress Note   Patient Name: Zachary Leonard       Date: 02/10/2015 DOB: 03/02/1932  Age: 81 y.o. MRN#: [id number] Attending Physician: Aldine Contes, MD Primary Care Physician: PROVIDER NOT IN SYSTEM Admit Date: 02/05/2015  Reason for Consultation/Follow-up: Establishing goals of care  Subjective: I spoke again today with Zachary Leonard. I asked many questions regarding how he is doing and his health. He is unable to answer any open ended questions about what is going on with his health. He does bring up discussion of feeding tube when I prompt him to the issue with his swallowing. He tells me "I'm still thinking about it." Unfortunately I do not really believe he is able to make a truly informed decision because he cannot rationalize the consequences of his decisions. Very difficult situation in which he has some capacity and able to firmly state his wishes but I am not sure he understands what these really mean for him. He appeared to become anxious when I was asking him some questions about what was going on with him - he would gaze off and begin to breathe harder and faster - this relaxed when I finished the conversation. I offered emotional support. No family at bedside. Will call sister Zachary Leonard tomorrow with update.    Length of Stay: 5 days  Current Medications: Scheduled Meds:  . antiseptic oral rinse  7 mL Mouth Rinse q12n4p  . cefTRIAXone (ROCEPHIN)  IV  1 g Intravenous Q24H  . chlorhexidine  15 mL Mouth Rinse BID  . multivitamin with minerals  1 tablet Oral Daily    Continuous Infusions: . diltiazem (CARDIZEM) infusion 5 mg/hr (02/08/15 2248)  . heparin 800 Units/hr (02/09/15 0915)    PRN Meds: acetaminophen **OR** acetaminophen, bisacodyl  Physical Exam: Physical Exam    Constitutional: He appears well-developed.  HENT:  Head: Normocephalic.  Cardiovascular: Normal rate.   Pulmonary/Chest: Effort normal. No respiratory distress.  Abdominal: Soft. Normal appearance. He exhibits no distension.  Neurological: He is alert. He is disoriented.  Psychiatric: His mood appears anxious.                Vital Signs: BP 150/88 mmHg  Pulse 50  Temp(Src) 97.6 F (36.4 C) (Oral)  Resp 15  Ht 5\' 7"  (1.702 m)  Wt 75.07 kg (165  lb 8 oz)  BMI 25.91 kg/m2  SpO2 97% SpO2: SpO2: 97 % O2 Device: O2 Device: Not Delivered O2 Flow Rate: O2 Flow Rate (L/min): 4 L/min  Intake/output summary:  Intake/Output Summary (Last 24 hours) at 02/10/15 1215 Last data filed at 02/10/15 0533  Gross per 24 hour  Intake    562 ml  Output   1150 ml  Net   -588 ml   LBM: Last BM Date: 02/08/15 Baseline Weight: Weight: 68.13 kg (150 lb 3.2 oz) Most recent weight: Weight: 75.07 kg (165 lb 8 oz)       Palliative Assessment/Data: Flowsheet Rows        Most Recent Value   Intake Tab    Referral Department  -- [internal medicine]   Unit at Time of Referral  Intermediate Care Unit   Palliative Care Primary Diagnosis  Sepsis/Infectious Disease   Date Notified  02/06/15   Palliative Care Type  Return patient Palliative Care   Reason for referral  Clarify Goals of Care   Date of Admission  02/05/15   Date first seen by Palliative Care  02/06/15   # of days Palliative referral response time  0 Day(s)   # of days IP prior to Palliative referral  1   Clinical Assessment    Psychosocial & Spiritual Assessment    Palliative Care Outcomes       Additional Data Reviewed: CBC    Component Value Date/Time   WBC 14.3* 02/10/2015 1130   RBC 3.20* 02/10/2015 1130   HGB 9.9* 02/10/2015 1130   HCT 30.4* 02/10/2015 1130   PLT 181 02/10/2015 1130   MCV 95.0 02/10/2015 1130   MCH 30.9 02/10/2015 1130   MCHC 32.6 02/10/2015 1130   RDW 15.6* 02/10/2015 1130   LYMPHSABS 1.4 02/05/2015  0506   MONOABS 1.6* 02/05/2015 0506   EOSABS 0.0 02/05/2015 0506   BASOSABS 0.0 02/05/2015 0506    CMP     Component Value Date/Time   NA 145 02/09/2015 0455   K 3.2* 02/09/2015 0455   CL 115* 02/09/2015 0455   CO2 20* 02/09/2015 0455   GLUCOSE 148* 02/09/2015 0455   BUN 37* 02/09/2015 0455   CREATININE 2.59* 02/09/2015 0455   CALCIUM 7.9* 02/09/2015 0455   PROT 5.6* 02/07/2015 0622   ALBUMIN 2.0* 02/09/2015 0455   AST 147* 02/07/2015 0622   ALT 185* 02/07/2015 0622   ALKPHOS 315* 02/07/2015 0622   BILITOT 1.9* 02/07/2015 0622   GFRNONAA 22* 02/09/2015 0455   GFRAA 25* 02/09/2015 0455       Problem List:  Patient Active Problem List   Diagnosis Date Noted  . Palliative care encounter 02/07/2015  . Dysphagia   . Sepsis ([place]) 02/05/2015  . Memory deficit after cerebral infarction 02/05/2015  . Personal history of prostate cancer 02/05/2015  . Acute kidney injury ([place])   . History of CVA (cerebrovascular accident)   . CVA (cerebral infarction) 11/27/2014  . Benign essential HTN 11/27/2014  . Elevated troponin 11/27/2014  . Rhabdomyolysis 11/27/2014  . AKI (acute kidney injury) ([place]) 11/27/2014     Palliative Care Assessment & Plan    1.Code Status:  Full code    Code Status Orders        Start     Ordered   02/05/15 0958  Full code   Continuous     02/05/15 0957       2. Goals of Care/Additional Recommendations:  Goals are unclear except to continue to  live. No limitations on care at this point.   Desire for further Chaplaincy support:yes  3. Symptom Management:      1. No current symptoms. SLP following for dysphagia but will likely need PEG if desiring aggressive care.   4. Palliative Prophylaxis:   Bowel Regimen, Delirium Protocol and Turn Reposition  5. Prognosis: Unable to determine but very poor.   6. Discharge Planning:  [place] for rehab with Palliative care service follow-up   Thank you for allowing the  Palliative Medicine Team to assist in the care of this patient.   Time In: 1000 Time Out: 1020 Total Time 52min Prolonged Time Billed  no         Pershing Proud, NP  [id number], 12:15 PM  Please contact Palliative Medicine Team phone at [phone] for questions and concerns.

## 2015-02-10 NOTE — Progress Notes (Signed)
Subjective:  No acute events overnight. Doing well. HR controlled this am. Still thinking about PEG tube.   Objective Vital signs in last 24 hours: Filed Vitals:   02/09/15 2348 02/10/15 0400 02/10/15 0500 02/10/15 0834  BP: 127/77 133/104  150/88  Pulse: 105 58  50  Temp: 98 F (36.7 C)   97.6 F (36.4 C)  TempSrc: Axillary   Oral  Resp: 14 8  15   Height:      Weight:   165 lb 8 oz (75.07 kg)   SpO2: 91% 90%  97%   Weight change: 2 lb 8 oz (1.134 kg)  Intake/Output Summary (Last 24 hours) at 02/10/15 0845 Last data filed at 02/10/15 0533  Gross per 24 hour  Intake  770.5 ml  Output   1150 ml  Net -379.5 ml    Assessment/ Plan: Pt is a 81 y.o. yo male chronically ill but without any CKD who was admitted on 02/05/2015 with hypotension/Afib/volue depletion and A on CRF  Assessment/Plan: 1. Renal- A on CRF- all signs are point to ATN from hypotension/vol depletion/Afib on an ACE- U/A with granular casts, U/S unremarkable.  Now with foley catheter placement Making 1.1L in the last 24 hours. Creatinine trending down, but labs pending this am.  2. HTN/vol- BP marginal- no BP meds- I agree with some IVF especially since is NPO for dysphagia issues 3. Hypernatremia- 1/2 NS- is stable. Follow up am labs.  4. Anemia- mild and multifactorial- supportive care 5. Hypokalemia- repleted yesterday. Follow up today.    [place]    Labs: Basic Metabolic Panel:  Recent Labs Lab 02/05/15 1430  02/07/15 0622 02/08/15 0348 02/09/15 0455  NA  --   < > 146* 146* 145  K  --   < > 5.2* 3.4* 3.2*  CL  --   < > 115* 115* 115*  CO2  --   < > 17* 18* 20*  GLUCOSE  --   < > 96 110* 148*  BUN  --   < > 41* 41* 37*  CREATININE  --   < > 2.90* 2.84* 2.59*  CALCIUM  --   < > 7.4* 7.6* 7.9*  PHOS 5.9*  --   --  4.0 2.9  < > = values in this interval not displayed. Liver Function Tests:  Recent Labs Lab 02/05/15 0506 02/06/15 0355 02/07/15 0622 02/08/15 0348 02/09/15 0455  AST  136* 89* 147*  --   --   ALT 66* 92* 185*  --   --   ALKPHOS 111 222* 315*  --   --   BILITOT 1.4* 1.5* 1.9*  --   --   PROT 5.8* 6.1* 5.6*  --   --   ALBUMIN 2.2* 2.1* 2.0* 2.0* 2.0*   No results for input(s): LIPASE, AMYLASE in the last 168 hours. No results for input(s): AMMONIA in the last 168 hours. CBC:  Recent Labs Lab 02/05/15 0506  02/06/15 0355 02/07/15 0622 02/08/15 0348 02/09/15 0455  WBC 11.4*  --  18.8* 17.5* 15.9* 13.8*  NEUTROABS 8.4*  --   --   --   --   --   HGB 12.1*  < > 11.6* 11.6* 10.8* 10.6*  HCT 37.7*  < > 36.6* 35.4* 33.7* 32.7*  MCV 95.9  --  97.6 94.9 94.1 94.5  PLT 229  --  232 171 188 183  < > = values in this interval not displayed. Cardiac Enzymes:  Recent Labs Lab  02/05/15 0506 02/05/15 1430 02/05/15 2032 02/06/15 0355 02/06/15 1052  CKTOTAL 47*  --   --   --   --   TROPONINI  --  1.36*  1.27* 1.67* 1.61* 0.96*   CBG:  Recent Labs Lab 02/05/15 1952 02/06/15 0310 02/06/15 2313  GLUCAP 88 86 98    Iron Studies: No results for input(s): IRON, TIBC, TRANSFERRIN, FERRITIN in the last 72 hours. Studies/Results: No results found. Medications: Infusions: . diltiazem (CARDIZEM) infusion 5 mg/hr (02/08/15 2248)  . heparin 800 Units/hr (02/09/15 0915)    Scheduled Medications: . antiseptic oral rinse  7 mL Mouth Rinse q12n4p  . cefTRIAXone (ROCEPHIN)  IV  1 g Intravenous Q24H  . chlorhexidine  15 mL Mouth Rinse BID  . multivitamin with minerals  1 tablet Oral Daily    have reviewed scheduled and prn medications.  Physical Exam: General: Resting in bed NAD Heart: irregularly irregular. Appropriate rate.  Lungs: CTA Bilaterally.  Abdomen: soft, non tender, ND, +BS Extremities: dependent edema. Edema of Upper extremities as well. MAEW.     02/10/2015,8:45 AM  LOS: 5 days

## 2015-02-11 ENCOUNTER — Inpatient Hospital Stay (HOSPITAL_COMMUNITY): Payer: Medicare Other

## 2015-02-11 LAB — RENAL FUNCTION PANEL
Albumin: 2.1 g/dL — ABNORMAL LOW (ref 3.5–5.0)
Anion gap: 9 (ref 5–15)
BUN: 25 mg/dL — ABNORMAL HIGH (ref 6–20)
CALCIUM: 8.4 mg/dL — AB (ref 8.9–10.3)
CO2: 20 mmol/L — ABNORMAL LOW (ref 22–32)
CREATININE: 2.04 mg/dL — AB (ref 0.61–1.24)
Chloride: 115 mmol/L — ABNORMAL HIGH (ref 101–111)
GFR calc non Af Amer: 29 mL/min — ABNORMAL LOW (ref >60)
GFR, EST AFRICAN AMERICAN: 33 mL/min — AB (ref >60)
Glucose, Bld: 105 mg/dL — ABNORMAL HIGH (ref 65–99)
PHOSPHORUS: 4.1 mg/dL (ref 2.5–4.6)
Potassium: 3.8 mmol/L (ref 3.5–5.1)
SODIUM: 144 mmol/L (ref 135–145)

## 2015-02-11 LAB — CBC
HCT: 31.8 % — ABNORMAL LOW (ref 39.0–52.0)
Hemoglobin: 10.5 g/dL — ABNORMAL LOW (ref 13.0–17.0)
MCH: 31.4 pg (ref 26.0–34.0)
MCHC: 33 g/dL (ref 30.0–36.0)
MCV: 95.2 fL (ref 78.0–100.0)
PLATELETS: 162 10*3/uL (ref 150–400)
RBC: 3.34 MIL/uL — ABNORMAL LOW (ref 4.22–5.81)
RDW: 15.7 % — AB (ref 11.5–15.5)
WBC: 13.6 10*3/uL — ABNORMAL HIGH (ref 4.0–10.5)

## 2015-02-11 LAB — HEPARIN LEVEL (UNFRACTIONATED): HEPARIN UNFRACTIONATED: 0.41 [IU]/mL (ref 0.30–0.70)

## 2015-02-11 MED ORDER — RESOURCE THICKENUP CLEAR PO POWD
ORAL | Status: DC | PRN
  Filled 2015-02-11: qty 125

## 2015-02-11 MED ORDER — DEXTROSE-NACL 5-0.45 % IV SOLN
INTRAVENOUS | Status: AC
  Administered 2015-02-11: 1000 mL via INTRAVENOUS

## 2015-02-11 NOTE — Progress Notes (Signed)
ANTICOAGULATION CONSULT NOTE - Follow Up Consult  Pharmacy Consult for Heparin Indication: DVT  No Known Allergies   Assessment: 81yo male with LLE DVT.  HL 0.41, though remains therapeutic.  Hg is ~stable and pltc wnl.  No bleeding noted.   Goal of Therapy:  Heparin level 0.3-0.7 units/ml Monitor platelets by anticoagulation protocol: Yes   Plan:  Continue heparin at 750 units / hr Watch for s/s of bleeding F/U in AM  Start po anti-coagulation?  - Coumadin?       Patient Measurements: Height: 5\' 7"  (170.2 cm) (estimated) Weight: 167 lb 5.3 oz (75.9 kg) IBW/kg (Calculated) : 66.1 Heparin Dosing Weight:   Vital Signs: Temp: 97.3 F (36.3 C) (11/15 1200) Temp Source: Oral (11/15 1200) BP: 167/105 mmHg (11/15 1200) Pulse Rate: 90 (11/15 1200)  Labs:  Recent Labs  02/09/15 0455 02/10/15 1130 02/11/15 0710  HGB 10.6* 9.9* 10.5*  HCT 32.7* 30.4* 31.8*  PLT 183 181 162  HEPARINUNFRC 0.42 0.57 0.41  CREATININE 2.59* 2.22* 2.04*    Estimated Creatinine Clearance: 26.6 mL/min (by C-G formula based on Cr of 2.04).   Medications:  Scheduled:  . antiseptic oral rinse  7 mL Mouth Rinse q12n4p  . chlorhexidine  15 mL Mouth Rinse BID  . multivitamin with minerals  1 tablet Oral Daily    Thank you Anette Guarneri, PharmD [phone]

## 2015-02-11 NOTE — Progress Notes (Signed)
Occupational Therapy Treatment Patient Details Name: Zachary Leonard MRN: [id number] DOB: 09-26-1931 Today's Date: 02/11/2015    History of present illness 81 y/o male with PMH of prostate cancer s/p RT, CVA, HTN, NSTEMI p/w worsening SOB from SNF. He was unable to provide a good history. He was recently discharged form [facility] for acute R MCA CVA with persistent left sided weakness. He presents to the ED by EMS from [facility] with dyspnea only able to speak in 1-2 word sentences and found to be in afib with RVR with lactic acidosis and hypotension   OT comments  Pt making slow, gradual improvements with sitting balance and trunk control.   Follow Up Recommendations  SNF    Equipment Recommendations       Recommendations for Other Services      Precautions / Restrictions Precautions Precautions: Fall       Mobility Bed Mobility Overal bed mobility: Needs Assistance Bed Mobility: Supine to Sit;Sit to Supine     Supine to sit: Max assist Sit to supine: Max assist   General bed mobility comments: Pt able to assist with moving Rt LE and with lifting trunk, but requires assist to initiate task, to move LE, and to push fully into sitting   Transfers                      Balance Overall balance assessment: Needs assistance Sitting-balance support: Feet supported Sitting balance-Leahy Scale: Poor Sitting balance - Comments: Pt able to sit EOB x 30 seconds with min guard assist.  He required min - mod A to maintain sitting.  He requires facilitation for neutral pelvic tilt and to shift trunk to Lt - tends to lean to Rt.                            ADL Overall ADL's : Needs assistance/impaired Eating/Feeding: Moderate assistance;Sitting Eating/Feeding Details (indicate cue type and reason): Pt able to wash his face while sitting EOB with mod A                                 Functional mobility during ADLs: Maximal assistance (bed mobility  )        Vision                     Perception     Praxis      Cognition   Behavior During Therapy: [facility] for tasks assessed/performed Overall Cognitive Status: History of cognitive impairments - at baseline                       Extremity/Trunk Assessment               Exercises     Shoulder Instructions       General Comments      Pertinent Vitals/ Pain       Pain Assessment: No/denies pain  Home Living                                          Prior Functioning/Environment              Frequency Min 2X/week     Progress Toward Goals  OT Goals(current goals can  now be found in the care plan section)        Plan Discharge plan remains appropriate    Co-evaluation                 End of Session     Activity Tolerance Patient tolerated treatment well   Patient Left in bed;with call bell/phone within reach   Nurse Communication Mobility status        Time: [id number] OT Time Calculation (min): 31 min  Charges: OT General Charges $OT Visit: 1 Procedure OT Treatments $Neuromuscular Re-education: 23-37 mins  Grant Swager M 02/11/2015, 3:08 PM

## 2015-02-11 NOTE — Progress Notes (Signed)
Report given to Ly from [facility] [place]. Transferred patient to 2W15 via bed without complications.

## 2015-02-11 NOTE — Progress Notes (Signed)
Speech Pathology   MBSS complete. Full report located under chart review in imaging section. Double click on DG swallow function.   Swallow function improved moderately from prior study. Esophageal scan revealed continued stasis however lessened amount. Mild lingual residue following puree and honey. Pharyngeal phase characterized by sensory impairment resulting in delayed swallow initiation. Thin barium and a "syrup-like" consistency (in between thin and nectar) led to silent laryngeal penetration due to delayed swallow initiation. No pharyngeal residue present. Aspiration risk is moderate due to esophageal impairments coupled with cognitive deficits. Recommend Dys 1 (puree) and nectar thick liquids (no V-8 or buttermilk which are considered nectar but thinner than pre-thickener nectar), crush meds, full supervision and NO straws    Zachary Leonard M.Ed [employer] [phone]

## 2015-02-11 NOTE — Progress Notes (Signed)
Patient seen and examined. Case d/w residents in detail. I agree with findings and plan as documented in Dr. Serita Grit note.  Patient is more awake and alert today. SLP f/u appreciated. Patient to be advanced to dysphagia 1 diet. Patient will need to be transitioned off heparin gtt to coumadin and off cardizem gtt to PO cardizem to treat his afib and DVT.  Of note patient now hypertensive. Would consider starting anti hypertensives if BP remains elevated. Sepsis is improving. D/c ceftriaxone after today's dose (completed 7 days of abx)   AKI improving. Hold lisinopril and HCTZ and monitor.

## 2015-02-11 NOTE — Progress Notes (Signed)
Subjective: Patient feels "alright" this morning. Has expressed that he would like to eat soft foods despite the risk for aspiration and does not want to be operated on at this time for PEG tube. He denies any pain at this time.  Objective: Vital signs in last 24 hours: Filed Vitals:   02/11/15 0400 02/11/15 0500 02/11/15 0952 02/11/15 1200  BP: 150/103  156/84 167/105  Pulse: 61  64 90  Temp: 97.6 F (36.4 C)  97.4 F (36.3 C) 97.3 F (36.3 C)  TempSrc: Oral  Oral Oral  Resp: 24  20 20   Height:      Weight:  167 lb 5.3 oz (75.9 kg)    SpO2: 99%  100% 97%   Weight change: 1 lb 13.3 oz (0.83 kg)  Intake/Output Summary (Last 24 hours) at 02/11/15 1458 Last data filed at 02/11/15 1200  Gross per 24 hour  Intake    250 ml  Output   2300 ml  Net  -2050 ml   General: resting in bed Cardiac: irregularly irregular, no murmurs heard Pulm: clear to auscultation anteriorly Abd: soft, nontender, nondistended, BS present Ext: +2 pitting edema of LEs, UE edema  Neuro: alert and oriented   Assessment/Plan: Principal Problem:   Sepsis ([place]) Active Problems:   Elevated troponin   AKI (acute kidney injury) ([place])   History of CVA (cerebrovascular accident)   Memory deficit after cerebral infarction   Personal history of prostate cancer   Acute kidney injury ([place])   Palliative care encounter   Dysphagia  81 y/o male with PMH of Prostate CA s/p XRT, HTN, CVA, and Dysphagia who presented with hypotension, respiratory distress, hypothermia, and tachycardic irregularly irregular rhythm.   Sepsis: Improving. Originally on [place] from 11/9 - 11/12 when switched to Ceftriaxone as blood cultures and urine culture negative. -Last dose of Ceftriaxone today(11/15) to complete 7 days total abx course -monitor vitals -f/u blood cultures, urine culture --> NGTD  AKI: Improving, likely ATN from sepsis causing hypotension, was on Ace-I as well.  SCr trending down, 2.02 this morning.   UOP of 1600 mLs yesterday. -Hold lisinopril and HCTZ -strict I/Os, monitor urine output -appreciate nephro consult   LLE DVT with possible PE - Occlusive DVT noted in left profunda femoral vein and left peroneal veins, no DVT RLE. No CT angio PE study was done because of AKI. TTE without evidence of right heart strain. Will not pursue CT angio at this time due to AKI and no change in management if PE identified. Repeat CXR on 11/10 with central pulmonary vascular prominence and cardiomegaly without consolidation which may indicate underlying PE. -Continue IV heparin for DVT treatment and also probable PE   Acute A. Fib w/ RVR: Patient noted to be in A. Fib with RVR on 11/12 with HR in 130s-140s. Started on Diltiazem gtt. Rate controlled <110 currently and blood pressures stable. -Hold Aspirin & Plavix -Continue IV Heparin -Continue Diltiazem gtt  Dysphagia: likely 2/2 to recent stroke. Swallowing study on 11/10 showed significant risk for aspiration even with thin liquids. Speech Pathology reassessed patient again today and have told me he has some improvement with his swallowing function and will be able to start a pureed food diet. This still offers risk for aspiration, but is more reassuring as it is inline with patient's wishes for oral food and to avoid NG and PEG tubes. -Dysphagia 1 diet as per Speech Pathology -Appreciate Speech Pathology recommendations  Hypokalemia - repletion PRN.  Goals of Care: Patient has expressed his desire to eat soft foods even when educated about the high risk of aspiration. He says he does not want to be operated on at this time for a PEG tube. As above, he will be started on a pureed diet as swallowing function slightly improved compared to last evaluation. He does continue to want mechanical intervention and intubation should that be necessary in the future. -Thank you to Palliative team for your help -Full Code status -Continue conversation with patient  and family  Dispo: Disposition is deferred at this time, awaiting improvement of current medical problems.     The patient does have transportation limitations that hinder transportation to clinic appointments.    LOS: 6 days   Zada Finders, MD 02/11/2015, 2:58 PM

## 2015-02-11 NOTE — Progress Notes (Signed)
Subjective:  No acute events overnight. Making good urine. Creatinine trending down. Still undecided about PEG tube.   Objective Vital signs in last 24 hours: Filed Vitals:   02/10/15 2000 02/10/15 2346 02/11/15 0400 02/11/15 0500  BP: 145/84 147/88 150/103   Pulse: 63 66 61   Temp: 98.3 F (36.8 C) 97.5 F (36.4 C) 97.6 F (36.4 C)   TempSrc: Oral Oral Oral   Resp: 0 26 24   Height:      Weight:    167 lb 5.3 oz (75.9 kg)  SpO2: 94% 98% 99%    Weight change: 1 lb 13.3 oz (0.83 kg)  Intake/Output Summary (Last 24 hours) at 02/11/15 0738 Last data filed at 02/11/15 0700  Gross per 24 hour  Intake    410 ml  Output   1600 ml  Net  -1190 ml    Assessment/ Plan: Pt is a 81 y.o. yo male chronically ill but without any CKD who was admitted on 02/05/2015 with hypotension/Afib/volue depletion and A on CRF  Assessment/Plan: 1. Renal- A on CRF- all signs are point to ATN from hypotension/vol depletion/Afib on an ACE- U/A with granular casts, U/S unremarkable.  Now with foley catheter placement Making 1.6L in the last 24 hours. Creatinine trending down.  - Resolving ATN. Expect continued recovery.  - biggest issue is goals of care at this point.  - fluids expired. Would add back D5NS MIVF while NPO.   2. HTN/vol- BP marginal- BP going. Up. Careful with Anti-HTN meds.  3. Hypernatremia- Resolved. Follow up am labs.  4. Anemia- mild and multifactorial- supportive care 5. Hypokalemia- repleted yesterday. Follow up today.   Will sign off today.    [employer]    Labs: Basic Metabolic Panel:  Recent Labs Lab 02/08/15 0348 02/09/15 0455 02/10/15 1130  NA 146* 145 144  K 3.4* 3.2* 3.8  CL 115* 115* 115*  CO2 18* 20* 19*  GLUCOSE 110* 148* 108*  BUN 41* 37* 30*  CREATININE 2.84* 2.59* 2.22*  CALCIUM 7.6* 7.9* 8.3*  PHOS 4.0 2.9 3.2   Liver Function Tests:  Recent Labs Lab 02/05/15 0506 02/06/15 0355 02/07/15 0622 02/08/15 0348 02/09/15 0455 02/10/15 1130  AST  136* 89* 147*  --   --   --   ALT 66* 92* 185*  --   --   --   ALKPHOS 111 222* 315*  --   --   --   BILITOT 1.4* 1.5* 1.9*  --   --   --   PROT 5.8* 6.1* 5.6*  --   --   --   ALBUMIN 2.2* 2.1* 2.0* 2.0* 2.0* 2.1*   No results for input(s): LIPASE, AMYLASE in the last 168 hours. No results for input(s): AMMONIA in the last 168 hours. CBC:  Recent Labs Lab 02/05/15 0506  02/06/15 0355 02/07/15 0622 02/08/15 0348 02/09/15 0455 02/10/15 1130  WBC 11.4*  --  18.8* 17.5* 15.9* 13.8* 14.3*  NEUTROABS 8.4*  --   --   --   --   --   --   HGB 12.1*  < > 11.6* 11.6* 10.8* 10.6* 9.9*  HCT 37.7*  < > 36.6* 35.4* 33.7* 32.7* 30.4*  MCV 95.9  --  97.6 94.9 94.1 94.5 95.0  PLT 229  --  232 171 188 183 181  < > = values in this interval not displayed. Cardiac Enzymes:  Recent Labs Lab 02/05/15 0506 02/05/15 1430 02/05/15 2032 02/06/15 0355 02/06/15 1052  CKTOTAL 47*  --   --   --   --   TROPONINI  --  1.36*  1.27* 1.67* 1.61* 0.96*   CBG:  Recent Labs Lab 02/05/15 1952 02/06/15 0310 02/06/15 2313  GLUCAP 88 86 98    Iron Studies: No results for input(s): IRON, TIBC, TRANSFERRIN, FERRITIN in the last 72 hours. Studies/Results: No results found. Medications: Infusions: . diltiazem (CARDIZEM) infusion 5 mg/hr (02/10/15 1900)  . heparin 750 Units/hr (02/10/15 1900)    Scheduled Medications: . antiseptic oral rinse  7 mL Mouth Rinse q12n4p  . cefTRIAXone (ROCEPHIN)  IV  1 g Intravenous Q24H  . chlorhexidine  15 mL Mouth Rinse BID  . multivitamin with minerals  1 tablet Oral Daily    have reviewed scheduled and prn medications.  Physical Exam: General: Resting in bed NAD Heart: irregularly irregular. Appropriate rate.  Lungs: CTA Bilaterally.  Abdomen: soft, non tender, ND, +BS Extremities: dependent edema. Edema of Upper extremities as well. MAEW. L> R LE edema due to LE DVT on the left.     02/11/2015,7:38 AM  LOS: 6 days

## 2015-02-11 NOTE — Progress Notes (Signed)
Speech Language Pathology Treatment: Dysphagia  Patient Details Name: Muaad Kravchenko MRN: [id number] DOB: 20-May-1931 Today's Date: 02/11/2015 Time: 1215-1228 SLP Time Calculation (min) (ACUTE ONLY): 13 min  Assessment / Plan / Recommendation Clinical Impression  Pt seen at bedside for diagnostic treatment of pt's alertness and subjective swallow function. Oral care removed copious dried secretions on hard palate followed by ice chip trials. Swallow initiation appeared timely for majority of trials. Intermittent wet vocal quality. Needs repeat objective assessment with MBS to assess oral and pharyngeal phases and make recommendations. MBS scheduled for today at 1330.    HPI HPI: Raees Atallah is a 81 y.o. male who has PMH of prostate cancer s/p radiation, CVA, HTN, rhabdomyolosis, AKI, cognitive impairment, and NSTEMI. He was seen by speech during recent admission 11/2014 and was started on a Dys 1 diet and nectar-thick liquids. He presented to the ED with sepsis of unclear origin. CXR showed low lung volumes but without PNA. MBS recommended after swallow treatment.      SLP Plan  Continue with current plan of care;MBS     Recommendations   MBS               Oral Care Recommendations: Oral care QID Follow up Recommendations: Skilled Nursing facility Plan: Continue with current plan of care;MBS   Houston Siren 02/11/2015, 1:35 PM  [name] Colvin Caroli.Ed [employer] [phone]

## 2015-02-11 NOTE — Progress Notes (Signed)
Daily Progress Note   Patient Name: Zachary Leonard       Date: 02/11/2015 DOB: Jun 12, 1931  Age: 81 y.o. MRN#: [id number] Attending Physician: Aldine Contes, MD Primary Care Physician: PROVIDER NOT IN SYSTEM Admit Date: 02/05/2015  Reason for Consultation/Follow-up: Establishing goals of care  Subjective: I met with Zachary Leonard again. He was asleep when I came into his room. He aroused easily. He is able to tell me that we have been talking about a feeding tube but he is scared of having "an operation" understandably he has been through so much already. He does confirm that he still wants everything done to keep him alive - including "machines." If this is his goal, and family agrees, I do think that placing a PEG would be the next step - understanding the risks of aspiration with PEG as well. I did speak with his sister, Zachary Leonard, and explained the situation and concern that he has not received any nutrition and recruited her assistance to help [place] discuss these options further with Zachary Leonard to reach a decision. Zachary Leonard understands that he has some understanding but difficulty understanding the consequences of these decisions. Zachary Leonard will contact me with a time when she has transportation to come visit and discuss further with [place] tomorrow. Discussed with teaching service and they will have SLP follow up with him again today as well.    Length of Stay: 6 days  Current Medications: Scheduled Meds:  . antiseptic oral rinse  7 mL Mouth Rinse q12n4p  . cefTRIAXone (ROCEPHIN)  IV  1 g Intravenous Q24H  . chlorhexidine  15 mL Mouth Rinse BID  . multivitamin with minerals  1 tablet Oral Daily    Continuous Infusions: . dextrose 5 % and 0.45% NaCl    . diltiazem (CARDIZEM) infusion 5 mg/hr (02/10/15 1900)  .  heparin 750 Units/hr (02/10/15 1900)    PRN Meds: acetaminophen **OR** acetaminophen, bisacodyl  Physical Exam: Physical Exam  Constitutional: He appears well-developed.  HENT:  Head: Normocephalic.  Cardiovascular: Normal rate.   Pulmonary/Chest: Effort normal. No respiratory distress.  Abdominal: Soft. Normal appearance. He exhibits no distension.  Neurological: He is alert. He is disoriented.                Vital Signs: BP 156/84 mmHg  Pulse 64  Temp(Src)  97.4 F (36.3 C) (Oral)  Resp 20  Ht _0  (1.702 m)  Wt 75.9 kg (167 lb 5.3 oz)  BMI 26.20 kg/m2  SpO2 100% SpO2: SpO2: 100 % O2 Device: O2 Device: Nasal Cannula O2 Flow Rate: O2 Flow Rate (L/min): 2 L/min  Intake/output summary:   Intake/Output Summary (Last 24 hours) at 02/11/15 1044 Last data filed at 02/11/15 0700  Gross per 24 hour  Intake    380 ml  Output   1600 ml  Net  -1220 ml   LBM: Last BM Date: 02/08/15 Baseline Weight: Weight: 68.13 kg (150 lb 3.2 oz) Most recent weight: Weight: 75.9 kg (167 lb 5.3 oz)       Palliative Assessment/Data: Flowsheet Rows        Most Recent Value   Intake Tab    Referral Department  -- [internal medicine]   Unit at Time of Referral  Intermediate Care Unit   Palliative Care Primary Diagnosis  Sepsis/Infectious Disease   Date Notified  02/06/15   Palliative Care Type  Return patient Palliative Care   Reason for referral  Clarify Goals of Care   Date of Admission  02/05/15   Date first seen by Palliative Care  02/06/15   # of days Palliative referral response time  0 Day(s)   # of days IP prior to Palliative referral  1   Clinical Assessment    Psychosocial & Spiritual Assessment    Palliative Care Outcomes       Additional Data Reviewed: CBC    Component Value Date/Time   WBC 13.6* 02/11/2015 0710   RBC 3.34* 02/11/2015 0710   HGB 10.5* 02/11/2015 0710   HCT 31.8* 02/11/2015 0710   PLT 162 02/11/2015 0710   MCV 95.2 02/11/2015 0710   MCH 31.4  02/11/2015 0710   MCHC 33.0 02/11/2015 0710   RDW 15.7* 02/11/2015 0710   LYMPHSABS 1.4 02/05/2015 0506   MONOABS 1.6* 02/05/2015 0506   EOSABS 0.0 02/05/2015 0506   BASOSABS 0.0 02/05/2015 0506    CMP     Component Value Date/Time   NA 144 02/11/2015 0710   K 3.8 02/11/2015 0710   CL 115* 02/11/2015 0710   CO2 20* 02/11/2015 0710   GLUCOSE 105* 02/11/2015 0710   BUN 25* 02/11/2015 0710   CREATININE 2.04* 02/11/2015 0710   CALCIUM 8.4* 02/11/2015 0710   PROT 5.6* 02/07/2015 0622   ALBUMIN 2.1* 02/11/2015 0710   AST 147* 02/07/2015 0622   ALT 185* 02/07/2015 0622   ALKPHOS 315* 02/07/2015 0622   BILITOT 1.9* 02/07/2015 0622   GFRNONAA 29* 02/11/2015 0710   GFRAA 33* 02/11/2015 0710       Problem List:  Patient Active Problem List   Diagnosis Date Noted  . Palliative care encounter 02/07/2015  . Dysphagia   . Sepsis ([place]) 02/05/2015  . Memory deficit after cerebral infarction 02/05/2015  . Personal history of prostate cancer 02/05/2015  . Acute kidney injury ([place])   . History of CVA (cerebrovascular accident)   . CVA (cerebral infarction) 11/27/2014  . Benign essential HTN 11/27/2014  . Elevated troponin 11/27/2014  . Rhabdomyolysis 11/27/2014  . AKI (acute kidney injury) ([place]) 11/27/2014     Palliative Care Assessment & Plan    1.Code Status:  Full code    Code Status Orders        Start     Ordered   02/05/15 0958  Full code   Continuous     02/05/15 0957  2. Goals of Care/Additional Recommendations:  Goals are unclear except to continue to live. No limitations on care at this point. Main decision now is to place PEG or not. Remeet with patient and his sister tomorrow.   Desire for further Chaplaincy support:yes  3. Symptom Management:      1. No current symptoms. SLP following for dysphagia but will likely need PEG if desiring aggressive care.   4. Palliative Prophylaxis:   Bowel Regimen, Delirium Protocol and Turn  Reposition  5. Prognosis: Unable to determine but very poor.   6. Discharge Planning:  [place] for rehab with Palliative care service follow-up   Thank you for allowing the Palliative Medicine Team to assist in the care of this patient.   Time In: 1020 Time Out: 1045 Total Time 55mn Prolonged Time Billed  no         APershing Proud NP  168/01/5725 10:44 AM  Please contact Palliative Medicine Team phone at [phone] questions and concerns.

## 2015-02-12 DIAGNOSIS — I480 Paroxysmal atrial fibrillation: Secondary | ICD-10-CM

## 2015-02-12 DIAGNOSIS — I824Y2 Acute embolism and thrombosis of unspecified deep veins of left proximal lower extremity: Secondary | ICD-10-CM

## 2015-02-12 DIAGNOSIS — I824Y9 Acute embolism and thrombosis of unspecified deep veins of unspecified proximal lower extremity: Secondary | ICD-10-CM | POA: Insufficient documentation

## 2015-02-12 LAB — CBC
HEMATOCRIT: 34.6 % — AB (ref 39.0–52.0)
Hemoglobin: 10.8 g/dL — ABNORMAL LOW (ref 13.0–17.0)
MCH: 30.4 pg (ref 26.0–34.0)
MCHC: 31.2 g/dL (ref 30.0–36.0)
MCV: 97.5 fL (ref 78.0–100.0)
Platelets: 146 10*3/uL — ABNORMAL LOW (ref 150–400)
RBC: 3.55 MIL/uL — ABNORMAL LOW (ref 4.22–5.81)
RDW: 16 % — AB (ref 11.5–15.5)
WBC: 12 10*3/uL — ABNORMAL HIGH (ref 4.0–10.5)

## 2015-02-12 LAB — RENAL FUNCTION PANEL
ANION GAP: 12 (ref 5–15)
Albumin: 2 g/dL — ABNORMAL LOW (ref 3.5–5.0)
BUN: 23 mg/dL — ABNORMAL HIGH (ref 6–20)
CALCIUM: 8.3 mg/dL — AB (ref 8.9–10.3)
CO2: 18 mmol/L — AB (ref 22–32)
Chloride: 116 mmol/L — ABNORMAL HIGH (ref 101–111)
Creatinine, Ser: 1.89 mg/dL — ABNORMAL HIGH (ref 0.61–1.24)
GFR calc Af Amer: 37 mL/min — ABNORMAL LOW (ref >60)
GFR calc non Af Amer: 32 mL/min — ABNORMAL LOW (ref >60)
GLUCOSE: 111 mg/dL — AB (ref 65–99)
Phosphorus: 4.4 mg/dL (ref 2.5–4.6)
Potassium: 4.2 mmol/L (ref 3.5–5.1)
SODIUM: 146 mmol/L — AB (ref 135–145)

## 2015-02-12 LAB — PROTIME-INR
INR: 1.16 (ref 0.00–1.49)
Prothrombin Time: 15 seconds (ref 11.6–15.2)

## 2015-02-12 LAB — HEPARIN LEVEL (UNFRACTIONATED): HEPARIN UNFRACTIONATED: 0.39 [IU]/mL (ref 0.30–0.70)

## 2015-02-12 MED ORDER — WARFARIN VIDEO: Freq: Once | Status: DC

## 2015-02-12 MED ORDER — DILTIAZEM HCL 30 MG PO TABS
30.0000 mg | ORAL_TABLET | Freq: Four times a day (QID) | ORAL | Status: DC
  Administered 2015-02-12 – 2015-02-13 (×6): 30 mg via ORAL
  Filled 2015-02-12 (×6): qty 1

## 2015-02-12 MED ORDER — COUMADIN BOOK
Freq: Once | Status: AC
  Administered 2015-02-12: 18:00:00
  Filled 2015-02-12: qty 1

## 2015-02-12 MED ORDER — ENOXAPARIN SODIUM 80 MG/0.8ML ~~LOC~~ SOLN
1.0000 mg/kg | SUBCUTANEOUS | Status: DC
  Administered 2015-02-12: 75 mg via SUBCUTANEOUS
  Filled 2015-02-12: qty 0.8

## 2015-02-12 MED ORDER — WARFARIN - PHARMACIST DOSING INPATIENT: Freq: Every day | Status: DC

## 2015-02-12 MED ORDER — WARFARIN SODIUM 5 MG PO TABS
5.0000 mg | ORAL_TABLET | Freq: Once | ORAL | Status: AC
  Administered 2015-02-12: 5 mg via ORAL
  Filled 2015-02-12: qty 1

## 2015-02-12 NOTE — Progress Notes (Signed)
Subjective: Patient feeling well. Able to tolerate soft foods so far.  Objective: Vital signs in last 24 hours: Filed Vitals:   02/11/15 1600 02/11/15 2000 02/11/15 2118 02/12/15 0552  BP: 154/99 122/77 143/81 153/74  Pulse: 61 66 71 54  Temp: 97.6 F (36.4 C) 98.3 F (36.8 C) 97.7 F (36.5 C) 97.5 F (36.4 C)  TempSrc:  Oral Oral Axillary  Resp: 21 34 18 20  Height:      Weight:    165 lb 11.2 oz (75.161 kg)  SpO2: 93% 93% 98% 99%   Weight change: -1 lb 10.1 oz (-0.739 kg)  Intake/Output Summary (Last 24 hours) at 02/12/15 1303 Last data filed at 02/12/15 1000  Gross per 24 hour  Intake 1357.31 ml  Output   1876 ml  Net -518.69 ml   General: resting in bed Cardiac: irregularly irregular, no murmurs heard Pulm: clear to auscultation anteriorly Abd: soft, nontender, nondistended, BS present Ext: +2 pitting edema of LEs, UE edema  Neuro: alert and oriented   Assessment/Plan: Principal Problem:   Sepsis ([place]) Active Problems:   Elevated troponin   AKI (acute kidney injury) ([place])   History of CVA (cerebrovascular accident)   Memory deficit after cerebral infarction   Personal history of prostate cancer   Acute kidney injury ([place])   Palliative care encounter   Dysphagia  81 y/o male with PMH of Prostate CA s/p XRT, HTN, CVA, and Dysphagia who presented with hypotension, respiratory distress, hypothermia, and tachycardic irregularly irregular rhythm.   Sepsis: Improving. Originally on [place] from 11/9 - 11/12 when switched to Ceftriaxone as blood cultures and urine culture negative. -Abx course completed -monitor vitals -f/u blood cultures, urine culture --> NGTD  AKI: Improving, likely ATN from sepsis causing hypotension, was on Ace-I as well.  SCr trending down, 1.89 this morning.  UOP of 1975 mLs yesterday. -Hold lisinopril and HCTZ -strict I/Os, monitor urine output -appreciate nephro consult   LLE DVT with possible PE - Occlusive DVT noted in  left profunda femoral vein and left peroneal veins, no DVT RLE. No CT angio PE study was done because of AKI. TTE without evidence of right heart strain. Will not pursue CT angio at this time due to AKI and no change in management if PE identified. Repeat CXR on 11/10 with central pulmonary vascular prominence and cardiomegaly without consolidation which may indicate underlying PE. -d/c heparin --> Coumadin w/ lovenox bridge   Acute A. Fib w/ RVR: Patient noted to be in A. Fib with RVR on 11/12 with HR in 130s-140s. Started on Diltiazem gtt. Rate controlled <110 currently and blood pressures stable. -Hold Aspirin & Plavix -Start PO Coumadin with Lovenox bridge -Switch to PO Diltiazem 30 mg q6h  Dysphagia: likely 2/2 to recent stroke. Swallowing study on 11/10 showed significant risk for aspiration even with thin liquids. Speech Pathology reassessed patient again today and have told me he has some improvement with his swallowing function and will be able to start a pureed food diet. This still offers risk for aspiration, but is more reassuring as it is inline with patient's wishes for oral food and to avoid NG and PEG tubes. -Dysphagia 1 diet as per Speech Pathology -Appreciate Speech Pathology recommendations  Hypokalemia - repletion PRN.   Dispo: Disposition is deferred at this time, awaiting improvement of current medical problems.     The patient does have transportation limitations that hinder transportation to clinic appointments.    LOS: 7 days  Zada Finders, MD 02/12/2015, 1:03 PM

## 2015-02-12 NOTE — Progress Notes (Signed)
Patient ID: Zachary Leonard, male   DOB: [birth year], 81 y.o.   MRN: [id number] Medicine attending: I personally examined this patient this morning together with resident physician Dr. Zada Finders and we discussed the management plan. He remains afebrile. He has not completed a planned course of antibiotics. White blood count continues to trend down. He is swallowing much better. Speech therapist at the bedside and pleased with his progress. We are going to change him back to his oral Cardizem to control his atrial fibrillation and resume warfarin anticoagulation with a Lovenox bridge to cover both recurrent stroke risk and the acute left lower [place] DVT diagnosed on admission November 9. Renal function slowly improving. Peak creatinine 2.9. Baseline 1.0 recorded September 4. Current value 1.9. He has a dense left hemiplegia from previous right MCA area stroke which occurred on 11/27/2014.

## 2015-02-12 NOTE — Progress Notes (Signed)
Daily Progress Note   Patient Name: Zachary Leonard       Date: 02/12/2015 DOB: Jun 22, 1931  Age: 81 y.o. MRN#: [id number] Attending Physician: Aldine Contes, MD Primary Care Physician: PROVIDER NOT IN SYSTEM Admit Date: 02/05/2015  Reason for Consultation/Follow-up: Establishing goals of care  Subjective: Zachary Leonard is sleeping when I come into room but awakens to voice. He is happy to be able to have food/drink but still asking for water. His niece, Zachary Leonard's daughter, is at bedside and provides encouragement to him. He has no complaints. He is relieved that he does not need feeding tube. Although I do encourage him to continue thinking about this as this may come up again in the future. His only concern/desire is for water.   I did speak with Zachary Leonard and she is also relieved he does not need feeding tube at this time. I explained to her as well that he is high risk for further decline and that this could come up again in the future - she understands. She is asking when he will be discharged - directed her to teaching service team for more info. She has no further questions/concerns at this time. Plan is to continue with aggressive care with no limitations at this time.    Length of Stay: 7 days  Current Medications: Scheduled Meds:  . antiseptic oral rinse  7 mL Mouth Rinse q12n4p  . chlorhexidine  15 mL Mouth Rinse BID  . diltiazem  30 mg Oral 4 times per day  . multivitamin with minerals  1 tablet Oral Daily    Continuous Infusions: . heparin 750 Units/hr (02/10/15 1900)    PRN Meds: acetaminophen **OR** acetaminophen, bisacodyl, RESOURCE THICKENUP CLEAR  Physical Exam: Physical Exam  Constitutional: He appears well-developed.  HENT:  Head: Normocephalic.  Cardiovascular: Normal  rate.   Pulmonary/Chest: Effort normal. No respiratory distress.  Abdominal: Soft. Normal appearance. He exhibits no distension.  Neurological: He is alert. He is disoriented.                Vital Signs: BP 153/74 mmHg  Pulse 54  Temp(Src) 97.5 F (36.4 C) (Axillary)  Resp 20  Ht 5\' 7"  (1.702 m)  Wt 75.161 kg (165 lb 11.2 oz)  BMI 25.95 kg/m2  SpO2 99% SpO2: SpO2: 99 % O2 Device: O2  Device: Not Delivered O2 Flow Rate: O2 Flow Rate (L/min): 2 L/min  Intake/output summary:   Intake/Output Summary (Last 24 hours) at 02/12/15 1158 Last data filed at 02/12/15 1000  Gross per 24 hour  Intake 1357.31 ml  Output   2576 ml  Net -1218.69 ml   LBM: Last BM Date: 02/08/15 Baseline Weight: Weight: 68.13 kg (150 lb 3.2 oz) Most recent weight: Weight: 75.161 kg (165 lb 11.2 oz)       Palliative Assessment/Data: Flowsheet Rows        Most Recent Value   Intake Tab    Referral Department  -- [internal medicine]   Unit at Time of Referral  Intermediate Care Unit   Palliative Care Primary Diagnosis  Sepsis/Infectious Disease   Date Notified  02/06/15   Palliative Care Type  Return patient Palliative Care   Reason for referral  Clarify Goals of Care   Date of Admission  02/05/15   Date first seen by Palliative Care  02/06/15   # of days Palliative referral response time  0 Day(s)   # of days IP prior to Palliative referral  1   Clinical Assessment    Psychosocial & Spiritual Assessment    Palliative Care Outcomes       Additional Data Reviewed: CBC    Component Value Date/Time   WBC 12.0* 02/12/2015 0251   RBC 3.55* 02/12/2015 0251   HGB 10.8* 02/12/2015 0251   HCT 34.6* 02/12/2015 0251   PLT 146* 02/12/2015 0251   MCV 97.5 02/12/2015 0251   MCH 30.4 02/12/2015 0251   MCHC 31.2 02/12/2015 0251   RDW 16.0* 02/12/2015 0251   LYMPHSABS 1.4 02/05/2015 0506   MONOABS 1.6* 02/05/2015 0506   EOSABS 0.0 02/05/2015 0506   BASOSABS 0.0 02/05/2015 0506    CMP     Component  Value Date/Time   NA 146* 02/12/2015 0251   K 4.2 02/12/2015 0251   CL 116* 02/12/2015 0251   CO2 18* 02/12/2015 0251   GLUCOSE 111* 02/12/2015 0251   BUN 23* 02/12/2015 0251   CREATININE 1.89* 02/12/2015 0251   CALCIUM 8.3* 02/12/2015 0251   PROT 5.6* 02/07/2015 0622   ALBUMIN 2.0* 02/12/2015 0251   AST 147* 02/07/2015 0622   ALT 185* 02/07/2015 0622   ALKPHOS 315* 02/07/2015 0622   BILITOT 1.9* 02/07/2015 0622   GFRNONAA 32* 02/12/2015 0251   GFRAA 37* 02/12/2015 0251       Problem List:  Patient Active Problem List   Diagnosis Date Noted  . Palliative care encounter 02/07/2015  . Dysphagia   . Sepsis ([place]) 02/05/2015  . Memory deficit after cerebral infarction 02/05/2015  . Personal history of prostate cancer 02/05/2015  . Acute kidney injury ([place])   . History of CVA (cerebrovascular accident)   . CVA (cerebral infarction) 11/27/2014  . Benign essential HTN 11/27/2014  . Elevated troponin 11/27/2014  . Rhabdomyolysis 11/27/2014  . AKI (acute kidney injury) ([place]) 11/27/2014     Palliative Care Assessment & Plan    1.Code Status:  Full code    Code Status Orders        Start     Ordered   02/05/15 0958  Full code   Continuous     02/05/15 0957       2. Goals of Care/Additional Recommendations:  Goals are unclear except to continue to live. No limitations on care at this point. Main decision now is to place PEG or not. Remeet with patient and  his sister tomorrow.   Desire for further Chaplaincy support:yes  3. Symptom Management:      1. No current symptoms.   4. Palliative Prophylaxis:   Bowel Regimen, Delirium Protocol and Turn Reposition  5. Prognosis: Unable to determine but very poor.   6. Discharge Planning:  [place] for rehab with Palliative care service follow-up   Thank you for allowing the Palliative Medicine Team to assist in the care of this patient.   Time In: 1100 Time Out: 1130 Total Time 29min Prolonged  Time Billed  no         Pershing Proud, NP  [id number], 11:58 AM  Please contact Palliative Medicine Team phone at [phone] for questions and concerns.

## 2015-02-12 NOTE — Progress Notes (Signed)
Speech Language Pathology Treatment: Dysphagia  Patient Details Name: Zachary Leonard MRN: [id number] DOB: Mar 07, 1932 Today's Date: 02/12/2015 Time: 1002-1029 SLP Time Calculation (min) (ACUTE ONLY): 27 min  Assessment / Plan / Recommendation Clinical Impression  No family present for education re: MBS results and recommendations. RN tech reported pt did not want to eat breakfast. Following oral care and set up assist, pt self fed cup sip nectar juice and bites applesauce. Delayed oral transit and labial residue without sensation and likely delayed swallow initiation (observed during MBS). Educated Doctor, general practice for pt to sit up minimum 45 minutes after meals due to significant esophageal impairments (not formally diagnosed by MBS which diagnoses oral, pharyngeal and upper esophageal sphincter). Continue Dys 1 diet texture, nectar thick liquids, no straws, full supervision and assist, stay upright after meals and small sips.   HPI HPI: Zachary Leonard is a 81 y.o. male who has PMH of prostate cancer s/p radiation, CVA, HTN, rhabdomyolosis, AKI, cognitive impairment, and NSTEMI. He was seen by speech during recent admission 11/2014 and was started on a Dys 1 diet and nectar-thick liquids. He presented to the ED with sepsis of unclear origin. CXR showed low lung volumes but without PNA. Pt seen at bedside for treatment and repeat MBS recommended.      SLP Plan  Continue with current plan of care     Recommendations  Diet recommendations: Dysphagia 1 (puree);Nectar-thick liquid Liquids provided via: Cup;No straw Medication Administration: Crushed with puree Supervision: Patient able to self feed;Full supervision/cueing for compensatory strategies Compensations: Minimize environmental distractions;Slow rate;Small sips/bites Postural Changes and/or Swallow Maneuvers: Seated upright 90 degrees;Upright 30-60 min after meal              Oral Care Recommendations: Oral care QID Follow up Recommendations:  Skilled Nursing facility Plan: Continue with current plan of care   [place] Siren 02/12/2015, 10:34 AM  [name] Colvin Caroli.Ed [employer] [phone]

## 2015-02-12 NOTE — Progress Notes (Signed)
UR Completed. Mccrae Speciale, RN, BSN.  [phone] 

## 2015-02-12 NOTE — Progress Notes (Signed)
ANTICOAGULATION CONSULT NOTE - Follow Up Consult  Pharmacy Consult for Heparin >> Lovenox + Coumadin Indication: atrial fibrillation and DVT  No Known Allergies  Patient Measurements: Height: 5\' 7"  (170.2 cm) (estimated) Weight: 165 lb 11.2 oz (75.161 kg) IBW/kg (Calculated) : 66.1  Vital Signs: Temp: 97.5 F (36.4 C) (11/16 0552) Temp Source: Axillary (11/16 0552) BP: 153/74 mmHg (11/16 0552) Pulse Rate: 54 (11/16 0552)  Labs:  Recent Labs  02/10/15 1130 02/11/15 0710 02/12/15 0251  HGB 9.9* 10.5* 10.8*  HCT 30.4* 31.8* 34.6*  PLT 181 162 146*  HEPARINUNFRC 0.57 0.41 0.39  CREATININE 2.22* 2.04* 1.89*    Estimated Creatinine Clearance: 28.7 mL/min (by C-G formula based on Cr of 1.89).   Medications:  Scheduled:  . antiseptic oral rinse  7 mL Mouth Rinse q12n4p  . chlorhexidine  15 mL Mouth Rinse BID  . diltiazem  30 mg Oral 4 times per day  . multivitamin with minerals  1 tablet Oral Daily   Infusions:  . heparin 750 Units/hr (02/10/15 1900)    Assessment: 81 yo M who has been anticoagulated with heparin since 11/9 for new DVT.  Patient was NPO due to swallowing difficulties and was not initially started on Coumadin.  Pt was seen by SLP 11/15 and diet was advanced to allow for PO medication administration.  To start Coumadin.  Will need baseline INR checked.  Also to transition the patient to Lovenox for ease of administration, reduced lab draws.  Of note, pt has some AonCKD and SCr is trending down.  Based on current SCr 1.89, CrCl is <30 and patient qualifies for q24h Lovenox dosing.  As CrCl improves, will need to re-evaluate dosing.  Today is day #1 of 5 day minimum overlap for VTE treatment.  Goal of Therapy:  INR 2-3 Anti-Xa level 0.6-1 units/ml 4hrs after LMWH dose given Monitor platelets by anticoagulation protocol: Yes   Plan:  - Discontinue heparin and associated labs - Lovenox 75mg  SQ q24h (first dose 1 hour after heparin infusion stopped) -  Coumadin 5 mg PO x 1 tonight - Coumadin book/video - Daily INR - first now.  [employer], Pharm.D., BCPS Clinical Pharmacist Pager [phone] 02/12/2015 12:00 PM

## 2015-02-13 DIAGNOSIS — Z8546 Personal history of malignant neoplasm of prostate: Secondary | ICD-10-CM

## 2015-02-13 DIAGNOSIS — R7989 Other specified abnormal findings of blood chemistry: Secondary | ICD-10-CM | POA: Insufficient documentation

## 2015-02-13 DIAGNOSIS — R945 Abnormal results of liver function studies: Secondary | ICD-10-CM

## 2015-02-13 DIAGNOSIS — E872 Acidosis, unspecified: Secondary | ICD-10-CM | POA: Insufficient documentation

## 2015-02-13 DIAGNOSIS — I69311 Memory deficit following cerebral infarction: Secondary | ICD-10-CM

## 2015-02-13 LAB — RENAL FUNCTION PANEL
ANION GAP: 8 (ref 5–15)
Albumin: 2.1 g/dL — ABNORMAL LOW (ref 3.5–5.0)
BUN: 17 mg/dL (ref 6–20)
CALCIUM: 8.4 mg/dL — AB (ref 8.9–10.3)
CHLORIDE: 116 mmol/L — AB (ref 101–111)
CO2: 23 mmol/L (ref 22–32)
Creatinine, Ser: 1.65 mg/dL — ABNORMAL HIGH (ref 0.61–1.24)
GFR calc Af Amer: 43 mL/min — ABNORMAL LOW (ref >60)
GFR calc non Af Amer: 37 mL/min — ABNORMAL LOW (ref >60)
GLUCOSE: 110 mg/dL — AB (ref 65–99)
Phosphorus: 4.3 mg/dL (ref 2.5–4.6)
Potassium: 3.7 mmol/L (ref 3.5–5.1)
SODIUM: 147 mmol/L — AB (ref 135–145)

## 2015-02-13 LAB — CBC
HCT: 34.6 % — ABNORMAL LOW (ref 39.0–52.0)
HEMOGLOBIN: 10.8 g/dL — AB (ref 13.0–17.0)
MCH: 30 pg (ref 26.0–34.0)
MCHC: 31.2 g/dL (ref 30.0–36.0)
MCV: 96.1 fL (ref 78.0–100.0)
Platelets: 137 10*3/uL — ABNORMAL LOW (ref 150–400)
RBC: 3.6 MIL/uL — ABNORMAL LOW (ref 4.22–5.81)
RDW: 16.6 % — ABNORMAL HIGH (ref 11.5–15.5)
WBC: 10.6 10*3/uL — ABNORMAL HIGH (ref 4.0–10.5)

## 2015-02-13 LAB — PROTIME-INR
INR: 1.25 (ref 0.00–1.49)
Prothrombin Time: 15.8 seconds — ABNORMAL HIGH (ref 11.6–15.2)

## 2015-02-13 MED ORDER — DILTIAZEM HCL 30 MG PO TABS: 30.0000 mg | ORAL_TABLET | Freq: Four times a day (QID) | ORAL | Status: AC

## 2015-02-13 MED ORDER — ENOXAPARIN SODIUM 80 MG/0.8ML ~~LOC~~ SOLN
1.0000 mg/kg | Freq: Two times a day (BID) | SUBCUTANEOUS | Status: DC
  Administered 2015-02-13: 75 mg via SUBCUTANEOUS
  Filled 2015-02-13: qty 0.8

## 2015-02-13 MED ORDER — WARFARIN SODIUM 5 MG PO TABS
5.0000 mg | ORAL_TABLET | Freq: Once | ORAL | Status: AC
  Administered 2015-02-13: 5 mg via ORAL
  Filled 2015-02-13: qty 1

## 2015-02-13 MED ORDER — ENOXAPARIN SODIUM 150 MG/ML ~~LOC~~ SOLN: 1.0000 mg/kg | Freq: Two times a day (BID) | SUBCUTANEOUS | Status: DC

## 2015-02-13 MED ORDER — WARFARIN SODIUM 5 MG PO TABS: 5.0000 mg | ORAL_TABLET | Freq: Once | ORAL | Status: DC

## 2015-02-13 NOTE — Discharge Instructions (Signed)
Warfarin tablets What is this medicine? WARFARIN (WAR far in) is an anticoagulant. It is used to treat or prevent clots in the veins, arteries, lungs, or heart. This medicine may be used for other purposes; ask your health care provider or pharmacist if you have questions. What should I tell my health care provider before I take this medicine? They need to know if you have any of these conditions: -alcoholism -anemia -bleeding disorders -cancer -diabetes -heart disease -high blood pressure -history of bleeding in the gastrointestinal tract -history of stroke or other brain injury or disease -kidney or liver disease -protein C deficiency -protein S deficiency -psychosis or dementia -recent injury, recent or planned surgery or procedure -an unusual or allergic reaction to warfarin, other medicines, foods, dyes, or preservatives -pregnant or trying to get pregnant -breast-feeding How should I use this medicine? Take this medicine by mouth with a glass of water. Follow the directions on the prescription label. You can take this medicine with or without food. Take your medicine at the same time each day. Do not take it more often than directed. Do not stop taking except on your doctor's advice. Stopping this medicine may increase your risk of a blood clot. Be sure to refill your prescription before you run out of medicine. If your doctor or healthcare professional calls to change your dose, write down the dose and any other instructions. Always read the dose and instructions back to him or her to make sure you understand them. Tell your doctor or healthcare professional what strength of tablets you have on hand. Ask how many tablets you should take to equal your new dose. Write the date on the new instructions and keep them near your medicine. If you are told to stop taking your medicine until your next blood test, call your doctor or healthcare professional if you do not hear anything within 24  hours of the test to find out your new dose or when to restart your prior dose. A special MedGuide will be given to you by the pharmacist with each prescription and refill. Be sure to read this information carefully each time. Talk to your pediatrician regarding the use of this medicine in children. Special care may be needed. Overdosage: If you think you have taken too much of this medicine contact a poison control center or emergency room at once. NOTE: This medicine is only for you. Do not share this medicine with others. What if I miss a dose? It is important not to miss a dose. If you miss a dose, call your healthcare provider. Take the dose as soon as possible on the same day. If it is almost time for your next dose, take only that dose. Do not take double or extra doses to make up for a missed dose. What may interact with this medicine? Do not take this medicine with any of the following medications: -agents that prevent or dissolve blood clots -aspirin or other salicylates -danshen -dextrothyroxine -mifepristone -St. John's Wort -red yeast rice This medicine may also interact with the following medications: -acetaminophen -agents that lower cholesterol -alcohol -allopurinol -amiodarone -antibiotics or medicines for treating bacterial, fungal or viral infections -azathioprine -barbiturate medicines for inducing sleep or treating seizures -certain medicines for diabetes -certain medicines for heart rhythm problems -certain medicines for high blood pressure -chloral hydrate -cisapride -disulfiram -male hormones, including contraceptive or birth control pills -general anesthetics -herbal or dietary products like garlic, ginkgo, ginseng, green tea, or kava kava -influenza virus vaccine -male  hormones -medicines for mental depression or psychosis -medicines for some types of cancer -medicines for stomach problems -methylphenidate -NSAIDs, medicines for pain and  inflammation, like ibuprofen or naproxen -propoxyphene -quinidine, quinine -raloxifene -seizure or epilepsy medicine like carbamazepine, phenytoin, and valproic acid -steroids like cortisone and prednisone -tamoxifen -thyroid medicine -tramadol -vitamin c, vitamin e, and vitamin K -zafirlukast -zileuton This list may not describe all possible interactions. Give your health care provider a list of all the medicines, herbs, non-prescription drugs, or dietary supplements you use. Also tell them if you smoke, drink alcohol, or use illegal drugs. Some items may interact with your medicine. What should I watch for while using this medicine? Visit your doctor or health care professional for regular checks on your progress. You will need to have a blood test called a PT/INR regularly. The PT/INR blood test is done to make sure you are getting the right dose of this medicine. It is important to not miss your appointment for the blood tests. When you first start taking this medicine, these tests are done often. Once the correct dose is determined and you take your medicine properly, these tests can be done less often. Notify your doctor or health care professional and seek emergency treatment if you develop breathing problems; changes in vision; chest pain; severe, sudden headache; pain, swelling, warmth in the leg; trouble speaking; sudden numbness or weakness of the face, arm or leg. These can be signs that your condition has gotten worse. While you are taking this medicine, carry an identification card with your name, the name and dose of medicine(s) being used, and the name and phone number of your doctor or health care professional or person to contact in an emergency. Do not start taking or stop taking any medicines or over-the-counter medicines except on the advice of your doctor or health care professional. You should discuss your diet with your doctor or health care professional. Do not make major  changes in your diet. Vitamin K can affect how well this medicine works. Many foods contain vitamin K. It is important to eat a consistent amount of foods with vitamin K. Other foods with vitamin K that you should eat in consistent amounts are asparagus, basil, beef or pork liver, black eyed peas, broccoli, brussel sprouts, cabbage, chickpeas, cucumber with peel, green onions, green tea, okra, parsley, peas, thyme, and green leafy vegetables like beet greens, collard greens, endive, kale, mustard greens, spinach, turnip greens, watercress, or certain lettuces like green leaf or romaine. This medicine can cause birth defects or bleeding in an unborn child. Women of childbearing age should use effective birth control while taking this medicine. If a woman becomes pregnant while taking this medicine, she should discuss the potential risks and her options with her health care professional. Avoid sports and activities that might cause injury while you are using this medicine. Severe falls or injuries can cause unseen bleeding. Be careful when using sharp tools or knives. Consider using an Copy. Take special care brushing or flossing your teeth. Report any injuries, bruising, or red spots on the skin to your doctor or health care professional. If you have an illness that causes vomiting, diarrhea, or fever for more than a few days, contact your doctor. Also check with your doctor if you are unable to eat for several days. These problems can change the effect of this medicine. Even after you stop taking this medicine, it takes several days before your body recovers its normal ability to clot  blood. Ask your doctor or health care professional how long you need to be careful. If you are going to have surgery or dental work, tell your doctor or health care professional that you have been taking this medicine. What side effects may I notice from receiving this medicine? Side effects that you should report to  your doctor or health care professional as soon as possible: -back pain -chills -dizziness -fever -heavy menstrual bleeding or vaginal bleeding -painful, blue, or purple toes -painful, prolonged erection -signs and symptoms of bleeding such as bloody or black, tarry stools; red or dark-brown urine; spitting up blood or brown material that looks like coffee grounds; red spots on the skin; unusual bruising or bleeding from the eye, gums, or nose-skin rash, itching or skin damage -stomach pain -unusually weak or tired -yellowing of skin or eyes Side effects that usually do not require medical attention (report to your doctor or health care professional if they continue or are bothersome): -diarrhea -hair loss This list may not describe all possible side effects. Call your doctor for medical advice about side effects. You may report side effects to FDA at 1-800-FDA-1088. Where should I keep my medicine? Keep out of the reach of children. Store at room temperature between 15 and 30 degrees C (59 and 86 degrees F). Protect from light. Throw away any unused medicine after the expiration date. Do not flush down the toilet. NOTE: This sheet is a summary. It may not cover all possible information. If you have questions about this medicine, talk to your doctor, pharmacist, or health care provider.    2016, [employer]. (2012-10-04 12:17:56)  Information on my medicine - Coumadin   (Warfarin)  This medication education was reviewed with me or my healthcare representative as part of my discharge preparation.    Why was Coumadin prescribed for you? Coumadin was prescribed for you because you have a blood clot or a medical condition that can cause an increased risk of forming blood clots. Blood clots can cause serious health problems by blocking the flow of blood to the heart, lung, or brain. Coumadin can prevent harmful blood clots from forming. As a reminder your indication for Coumadin  is:   Deep Vein Thrombosis Treatment  What test will check on my response to Coumadin? While on Coumadin (warfarin) you will need to have an INR test regularly to ensure that your dose is keeping you in the desired range. The INR (international normalized ratio) number is calculated from the result of the laboratory test called prothrombin time (PT).  If an INR APPOINTMENT HAS NOT ALREADY BEEN MADE FOR YOU please schedule an appointment to have this lab work done by your health care provider within 7 days. Your INR goal is usually a number between:  2 to 3 or your provider may give you a more narrow range like 2-2.5.  Ask your health care provider during an office visit what your goal INR is.  What  do you need to  know  About  COUMADIN? Take Coumadin (warfarin) exactly as prescribed by your healthcare provider about the same time each day.  DO NOT stop taking without talking to the doctor who prescribed the medication.  Stopping without other blood clot prevention medication to take the place of Coumadin may increase your risk of developing a new clot or stroke.  Get refills before you run out.  What do you do if you miss a dose? If you miss a dose, take it as  soon as you remember on the same day then continue your regularly scheduled regimen the next day.  Do not take two doses of Coumadin at the same time.  Important Safety Information A possible side effect of Coumadin (Warfarin) is an increased risk of bleeding. You should call your healthcare provider right away if you experience any of the following: ? Bleeding from an injury or your nose that does not stop. ? Unusual colored urine (red or dark brown) or unusual colored stools (red or black). ? Unusual bruising for unknown reasons. ? A serious fall or if you hit your head (even if there is no bleeding).  Some foods or medicines interact with Coumadin (warfarin) and might alter your response to warfarin. To help avoid this: ? Eat a  balanced diet, maintaining a consistent amount of Vitamin K. ? Notify your provider about major diet changes you plan to make. ? Avoid alcohol or limit your intake to 1 drink for women and 2 drinks for men per day. (1 drink is 5 oz. wine, 12 oz. beer, or 1.5 oz. liquor.)  Make sure that ANY health care provider who prescribes medication for you knows that you are taking Coumadin (warfarin).  Also make sure the healthcare provider who is monitoring your Coumadin knows when you have started a new medication including herbals and non-prescription products.  Coumadin (Warfarin)  Major Drug Interactions  Increased Warfarin Effect Decreased Warfarin Effect  Alcohol (large quantities) Antibiotics (esp. Septra/Bactrim, Flagyl, Cipro) Amiodarone (Cordarone) Aspirin (ASA) Cimetidine (Tagamet) Megestrol (Megace) NSAIDs (ibuprofen, naproxen, etc.) Piroxicam (Feldene) Propafenone (Rythmol SR) Propranolol (Inderal) Isoniazid (INH) Posaconazole (Noxafil) Barbiturates (Phenobarbital) Carbamazepine (Tegretol) Chlordiazepoxide (Librium) Cholestyramine (Questran) Griseofulvin Oral Contraceptives Rifampin Sucralfate (Carafate) Vitamin K   Coumadin (Warfarin) Major Herbal Interactions  Increased Warfarin Effect Decreased Warfarin Effect  Garlic Ginseng Ginkgo biloba Coenzyme Q10 Green tea St. Johns wort    Coumadin (Warfarin) FOOD Interactions  Eat a consistent number of servings per week of foods HIGH in Vitamin K (1 serving =  cup)  Collards (cooked, or boiled & drained) Kale (cooked, or boiled & drained) Mustard greens (cooked, or boiled & drained) Parsley *serving size only =  cup Spinach (cooked, or boiled & drained) Swiss chard (cooked, or boiled & drained) Turnip greens (cooked, or boiled & drained)  Eat a consistent number of servings per week of foods MEDIUM-HIGH in Vitamin K (1 serving = 1 cup)  Asparagus (cooked, or boiled & drained) Broccoli (cooked, boiled & drained,  or raw & chopped) Brussel sprouts (cooked, or boiled & drained) *serving size only =  cup Lettuce, raw (green leaf, endive, romaine) Spinach, raw Turnip greens, raw & chopped   These websites have more information on Coumadin (warfarin):  FailFactory[employer]se; VeganReport.com.au;

## 2015-02-13 NOTE — Discharge Summary (Signed)
Name: Zachary Leonard MRN: [id number] DOB: [birth year] 80 y.o. 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   Personal history of prostate cancer   Acute kidney injury [facility])   Palliative care encounter   Dysphagia   Paroxysmal atrial fibrillation (HCC)   DVT, lower extremity, proximal, acute ([place])  Discharge Medications:   Medication List    STOP taking these medications        aspirin EC 81 MG tablet     atorvastatin 20 MG tablet  Commonly known as:  LIPITOR     clopidogrel 75 MG tablet  Commonly known as:  PLAVIX     lisinopril-hydrochlorothiazide 10-12.5 MG tablet  Commonly known as:  PRINZIDE,ZESTORETIC      TAKE these medications        diltiazem 30 MG tablet  Commonly known as:  CARDIZEM  Take 1 tablet (30 mg total) by mouth every 6 (six) hours.     enoxaparin 150 MG/ML injection  Commonly known as:  LOVENOX  Inject 0.5 mLs (75 mg total) into the skin every 12 (twelve) hours.     multivitamin with minerals Tabs tablet  Take 1 tablet by mouth daily.     warfarin 5 MG tablet  Commonly known as:  COUMADIN  Take 1 tablet (5 mg total) by mouth one time only at 6 PM.        Disposition and follow-up:   Mr.Zachary Leonard was discharged from [facility] in Stable condition.  At the hospital follow up visit please address:  1.  Atrial Fibrillation: Patient discharged on Coumadin with Lovenox bridge. Please continue Lovenox until INR at goal therapeutic range of 2.0 - 3.0. Patient will need long-term anti-coagulation with Coumadin. Patient currently rate controlled with oral Diltiazem 30 mg every 6 hours. Please assess  patient's heart rate for control <100 bpm.  AKI: Patient had acute renal failure and low urine output which improved with foley catheter. Please assess patient's urine output and renal function.  HTN: Patient's home Lisinopril-HCTZ was discontinued due to renal injury and hypovolemia. His blood pressures have been stable off of this medication. Please address if it is appropriate to stay off of this medication or resume.  Dysphagia: Patient with difficulty swallowing and at risk for aspiration. Discharged on Dysphagia 1 diet, please assess patient's swallowing function as appropriate.  Goals of Care: Discussed goals of care with patient and family extensively. He currently prefers aggressive care and Full Code status. Please readdress as appropriate.  2.  Labs / imaging needed at time of follow-up: CBC, BMP, Daily PT/INR  3.  Pending labs/ test needing follow-up: none  Follow-up Appointments:   Discharge Instructions: Discharge Instructions    Call MD for:  difficulty breathing, headache or visual disturbances    Complete by:  As directed      Call MD for:  extreme fatigue    Complete by:  As directed      Call MD for:  persistant dizziness or light-headedness    Complete by:  As directed      Call MD for:  persistant nausea and vomiting    Complete by:  As directed  Call MD for:  severe uncontrolled pain    Complete by:  As directed      Call MD for:  temperature >100.4    Complete by:  As directed      Diet - low sodium heart healthy    Complete by:  As directed      Increase activity slowly    Complete by:  As directed            Consultations: Treatment Team:  Palliative Zachary Halsted, MD  Procedures Performed:  Dg Chest 1 View  02/06/2015  CLINICAL DATA:  81 year old male with sepsis. Weakness. Subsequent encounter. EXAM: CHEST 1 VIEW COMPARISON:  02/05/2015. FINDINGS: Poor inspiratory portable chest x-ray reveals central pulmonary vascular  prominence and cardiomegaly without segmental consolidation or pneumothorax. Limited evaluation mediastinal structures with slight tortuosity of the aorta suspected. IMPRESSION: Poor inspiratory portable chest x-ray reveals central pulmonary vascular prominence and cardiomegaly without segmental consolidation. Minimal right peribronchial thickening. Electronically Signed   By: Zachary Leonard M.D.   On: 02/06/2015 07:31   Dg Chest 2 View  01/18/2015  CLINICAL DATA:  Shortness of breath, leukocytosis, hypertension, prostate cancer EXAM: CHEST  2 VIEW COMPARISON:  None FINDINGS: Minimal enlargement of cardiac silhouette. Atherosclerotic calcification aorta. Eventration RIGHT diaphragm. Mediastinal contours and pulmonary vascularity normal. Lungs clear. No infiltrate, pleural effusion or pneumothorax. Bones demineralized with scattered degenerative disc disease changes thoracic spine. IMPRESSION: Minimal enlargement of cardiac silhouette. No acute infiltrate. Electronically Signed   By: Zachary Leonard M.D.   On: 01/18/2015 12:59   US Abdomen Complete  02/07/2015  CLINICAL DATA:  Elevated liver function tests EXAM: ULTRASOUND ABDOMEN COMPLETE COMPARISON:  06/07/2009 abdominal CT FINDINGS: Gallbladder: No gallstones or wall thickening visualized. No sonographic Murphy sign noted. Common bile duct: Diameter: 3 mm where visualized Liver: No focal lesion identified. Within normal limits in parenchymal echogenicity. IVC: Limited visualization due to bowel gas.  No pathologic finding. Pancreas: Visualized portion unremarkable. Spleen: Size and appearance within normal limits. Right Kidney: Length: 10 cm. Echogenicity within normal limits. No mass or hydronephrosis visualized. Left Kidney: Length: 10 cm. Echogenicity within normal limits. No mass or hydronephrosis visualized. Abdominal aorta: No aneurysm visualized. Other findings: Right pleural effusion incidentally visualized. IMPRESSION: 1. No acute finding or  explanation for liver function tests. 2. Right pleural effusion. Electronically Signed   By: Zachary Leonard M.D.   On: 02/07/2015 21:43   Dg Chest Port 1 View  02/05/2015  CLINICAL DATA:  Elevated white count.  Dyspnea EXAM: PORTABLE CHEST 1 VIEW COMPARISON:  01/18/2015 FINDINGS: Hypoventilation with interstitial crowding. There is no edema, consolidation, effusion, or pneumothorax. Borderline cardiomegaly, accentuated by technique. Stable mild aortic tortuosity. IMPRESSION: Negative low volume chest. Electronically Signed   By: Zachary Leonard M.D.   On: 02/05/2015 05:30   Dg Swallowing Func-speech Pathology  02/11/2015  Objective Swallowing Evaluation:   MBS Patient Details Name: Ervan Omar MRN: [id number] Date of Birth: 1931-06-01 Today's Date: 02/11/2015 Time: SLP Start Time (ACUTE ONLY): 1345-SLP Stop Time (ACUTE ONLY): 1405 SLP Time Calculation (min) (ACUTE ONLY): 20 min Past Medical History: Past Medical History Diagnosis Date . Cancer [facility])    prostate, treated with XRT . HTN (hypertension)  Past Surgical History: Past Surgical History Procedure Laterality Date . No past surgeries   HPI: Krishav Lassila is a 81 y.o. male who has PMH of prostate cancer s/p radiation, CVA, HTN, rhabdomyolosis, AKI, cognitive impairment, and NSTEMI. He was seen by speech during recent admission  11/2014 and was started on a Dys 1 diet and nectar-thick liquids. He presented to the ED with sepsis of unclear origin. CXR showed low lung volumes but without PNA. Pt seen at bedside for treatment and repeat MBS recommended. Subjective: pt alert, minimally communicative but denies complaints Assessment / Plan / Recommendation CHL IP CLINICAL IMPRESSIONS 02/11/2015 Therapy Diagnosis Mild oral phase dysphagia;Suspected primary esophageal dysphagia;Mild pharyngeal phase dysphagia;Moderate pharyngeal phase dysphagia Clinical Impression Swallow function improved moderately from prior study. Esophageal scan revealed continued stasis however  lessened amount. Mild lingual residue following puree and honey. Pharyngeal phase characterized by sensory impairment resulting in delayed swallow initiation. Thin barium and a "syrup-like" consistency (in between thin and nectar) led to silent laryngeal penetration due to delayed swallow initiation. No pharyngeal residue present. Aspiration risk is moderate due to esophageal impairments coupled with cognitive deficits. Recommend Dys 1 (puree) and nectar thick liquids (no V-8 or buttermilk which are considered nectar but thinner than pre-thickener nectar), crush meds, full supervision and NO straws.       CHL IP TREATMENT RECOMMENDATION 02/11/2015 Treatment Recommendations Therapy as outlined in treatment plan below   CHL IP DIET RECOMMENDATION 02/11/2015 SLP Diet Recommendations Dysphagia 1 (Puree) solids;Nectar thick liquid Liquid Administration via Cup;No straw Medication Administration Crushed with puree Compensations Slow rate;Small sips/bites;Minimize environmental distractions;Lingual sweep for clearance of pocketing Postural Changes Seated upright at 90 degrees   CHL IP OTHER RECOMMENDATIONS 02/11/2015 Recommended Consults -- Oral Care Recommendations Oral care QID Other Recommendations --   CHL IP FOLLOW UP RECOMMENDATIONS 02/11/2015 Follow up Recommendations Skilled Nursing facility   [facility] IP FREQUENCY AND DURATION 02/11/2015 Speech Therapy Frequency (ACUTE ONLY) min 2x/week Treatment Duration 2 weeks      CHL IP ORAL PHASE 02/11/2015 Oral Phase Impaired Oral - Pudding Teaspoon -- Oral - Pudding Cup -- Oral - Honey Teaspoon Lingual/palatal residue Oral - Honey Cup -- Oral - Nectar Teaspoon WFL Oral - Nectar Cup WFL Oral - Nectar Straw -- Oral - Thin Teaspoon WFL Oral - Thin Cup WFL Oral - Thin Straw -- Oral - Puree Lingual/palatal residue Oral - Mech Soft -- Oral - Regular -- Oral - Multi-Consistency -- Oral - Pill -- Oral Phase - Comment --  CHL IP PHARYNGEAL PHASE 02/11/2015 Pharyngeal Phase  Honey;Nectar;Thin;Solids Pharyngeal- Pudding Teaspoon -- Pharyngeal -- Pharyngeal- Pudding Cup -- Pharyngeal -- Pharyngeal- Honey Teaspoon Delayed swallow initiation-vallecula Pharyngeal -- Pharyngeal- Honey Cup -- Pharyngeal -- Pharyngeal- Nectar Teaspoon NT Pharyngeal -- Pharyngeal- Nectar Cup Delayed swallow initiation-pyriform sinuses Pharyngeal -- Pharyngeal- Nectar Straw NT Pharyngeal -- Pharyngeal- Thin Teaspoon Delayed swallow initiation-pyriform sinuses Pharyngeal Material does not enter airway Pharyngeal- Thin Cup Penetration/Aspiration during swallow Pharyngeal Material enters airway, remains ABOVE vocal cords then ejected out Pharyngeal- Thin Straw NT Pharyngeal -- Pharyngeal- Puree Delayed swallow initiation-vallecula Pharyngeal -- Pharyngeal- Mechanical Soft NT Pharyngeal -- Pharyngeal- Regular NT Pharyngeal -- Pharyngeal- Multi-consistency NT Pharyngeal -- Pharyngeal- Pill NT Pharyngeal -- Pharyngeal Comment --  CHL IP CERVICAL ESOPHAGEAL PHASE 02/11/2015 Cervical Esophageal Phase Impaired Pudding Teaspoon -- Pudding Cup -- Honey Teaspoon -- Honey Cup -- Nectar Teaspoon -- Nectar Cup -- Nectar Straw -- Thin Teaspoon -- Thin Cup -- Thin Straw -- Puree -- Mechanical Soft -- Regular -- Multi-consistency -- Pill -- Cervical Esophageal Comment (No Data) [place] Siren 02/11/2015, 2:58 PM [name] Litaker M.Ed CCC-SLP Pager [phone]              Dg Swallowing Func-speech Pathology  02/06/2015  Objective Swallowing Evaluation:  MBS Patient Details Name: Crixus Hout MRN: [id number] Date of Birth: December 27, 1931 Today's Date: 02/06/2015 Time: SLP Start Time (ACUTE ONLY): 1320-SLP Stop Time (ACUTE ONLY): 1340 SLP Time Calculation (min) (ACUTE ONLY): 20 min Past Medical History: Past Medical History Diagnosis Date . Cancer [facility])    prostate, treated with XRT . HTN (hypertension)  Past Surgical History: Past Surgical History Procedure Laterality Date . No past surgeries   HPI: Reinhart Berridge is a 81 y.o. male  who has PMH of prostate cancer s/p radiation, CVA, HTN, rhabdomyolosis, AKI, cognitive impairment, and NSTEMI. He was seen by speech during recent admission 11/2014 and was started on a Dys 1 diet and nectar-thick liquids. He presented to the ED with sepsis of unclear origin. CXR showed low lung volumes but without PNA. MBS recommended after swallow treatment. Subjective: pt alert, minimally communicative but denies complaints Assessment / Plan / Recommendation CHL IP CLINICAL IMPRESSIONS 02/06/2015 Therapy Diagnosis Moderate oral phase dysphagia;Suspected primary esophageal dysphagia;Moderate pharyngeal phase dysphagia;Severe pharyngeal phase dysphagia Clinical Impression Pt exhibited a moderate oral dysphagia with delayed transit and lingual residue.  Suspect primary esophageal dysphagia with evidence of cricopharyngeal bar and poor clearance of esophagram (full of barium stasis at end of study). Decreased sensation led to swallow initiation to the pyriform sinuses (filling pyriforms prior to swallow initation). Moderate aspiration, sensed with cough, during the swallow with thin liquids. Pt's aspiration risk is significant due to increased respiratory effort (RR up to 47 x 1 during study), poor endurance, fatigue and poor reserve. Consumption of any po's will likely lead to eventual aspiration. Discussed results with pt, Dr. Posey Pronto, Palliative care RN Elmo Putt) and pt's niece Kennyth Lose). Discussions regarding wishes in regards to po's (pt verbalized his desire for "water") recommended by MD to pt/family.       CHL IP TREATMENT RECOMMENDATION 02/05/2015 Treatment Recommendations Therapy as outlined in treatment plan below   CHL IP DIET RECOMMENDATION 02/06/2015 SLP Diet Recommendations NPO Liquid Administration via -- Medication Administration -- Compensations -- Postural Changes --   CHL IP OTHER RECOMMENDATIONS 02/06/2015 Recommended Consults -- Oral Care Recommendations Oral care QID Other Recommendations --   CHL  IP FOLLOW UP RECOMMENDATIONS 02/06/2015 Follow up Recommendations Skilled Nursing facility   [facility] IP FREQUENCY AND DURATION 02/05/2015 Speech Therapy Frequency (ACUTE ONLY) min 2x/week Treatment Duration 2 weeks      CHL IP ORAL PHASE 02/06/2015 Oral Phase Impaired Oral - Pudding Teaspoon -- Oral - Pudding Cup -- Oral - Honey Teaspoon Delayed oral transit;Lingual/palatal residue;Weak lingual manipulation Oral - Honey Cup -- Oral - Nectar Teaspoon Delayed oral transit;Lingual/palatal residue;Weak lingual manipulation Oral - Nectar Cup Delayed oral transit;Lingual/palatal residue;Weak lingual manipulation Oral - Nectar Straw -- Oral - Thin Teaspoon Delayed oral transit;Lingual/palatal residue;Weak lingual manipulation Oral - Thin Cup Delayed oral transit;Lingual/palatal residue;Weak lingual manipulation Oral - Thin Straw -- Oral - Puree -- Oral - Mech Soft -- Oral - Regular -- Oral - Multi-Consistency -- Oral - Pill -- Oral Phase - Comment --  CHL IP PHARYNGEAL PHASE 02/06/2015 Pharyngeal Phase Honey;Thin Pharyngeal- Pudding Teaspoon -- Pharyngeal -- Pharyngeal- Pudding Cup -- Pharyngeal -- Pharyngeal- Honey Teaspoon Delayed swallow initiation-vallecula Pharyngeal -- Pharyngeal- Honey Cup NT Pharyngeal -- Pharyngeal- Nectar Teaspoon Delayed swallow initiation-pyriform sinuses Pharyngeal -- Pharyngeal- Nectar Cup Delayed swallow initiation-vallecula Pharyngeal -- Pharyngeal- Nectar Straw NT Pharyngeal -- Pharyngeal- Thin Teaspoon Delayed swallow initiation-pyriform sinuses Pharyngeal -- Pharyngeal- Thin Cup Pharyngeal residue - pyriform;Penetration/Aspiration during swallow Pharyngeal Material enters airway, passes BELOW cords and not ejected out  despite cough attempt by patient Pharyngeal- Thin Straw NT Pharyngeal -- Pharyngeal- Puree -- Pharyngeal -- Pharyngeal- Mechanical Soft -- Pharyngeal -- Pharyngeal- Regular -- Pharyngeal -- Pharyngeal- Multi-consistency -- Pharyngeal -- Pharyngeal- Pill -- Pharyngeal --  Pharyngeal Comment --  CHL IP CERVICAL ESOPHAGEAL PHASE 02/06/2015 Cervical Esophageal Phase Impaired Pudding Teaspoon -- Pudding Cup -- Honey Teaspoon -- Honey Cup -- Nectar Teaspoon -- Nectar Cup -- Nectar Straw -- Thin Teaspoon -- Thin Cup -- Thin Straw -- Puree -- Mechanical Soft -- Regular -- Multi-consistency -- Pill -- Cervical Esophageal Comment (No Data) [place] Siren 02/06/2015, 3:33 PM [name] Litaker M.Ed CCC-SLP Pager [phone]               2D Echo: TTE 02/05/15 - Left ventricle: The cavity size was normal. Wall thickness was increased in a pattern of mild LVH. Systolic function was normal. The estimated ejection fraction was in the range of 55% to 60%. Wall motion was normal; there were no regional wall motion abnormalities. The study is not technically sufficient to allow evaluation of LV diastolic function. - Mitral valve: Calcified annulus. There was mild regurgitation. - Tricuspid valve: There was moderate regurgitation.  Cardiac Cath: n/a  Admission HPI: Juliana Whelan is a 81 y.o. male who has PMH of prostate cancer s/p radiation, CVA, HTN, rhabdomyolosis, AKI, cognitive impairment, and NSTEMI.   He presents to the ED by EMS from [facility] with dyspnea only able to speak in 1-2 word sentences. He was found to be in Afib with RVR with a HR in the 180s and given 10mg  cardizem with HR now in the low 100s. He was also hypotensive with SBP of 60 which has now back up to the 90s on his 3rd liter of IVFs. He was put on a NRB with O2 SpO2 of 100. Pt was unable to provide much history but denied any pain, HA, N/V. He did report a cough and having chills but unclear if this is reliable as he was disoriented.   He was recently discharged in September to ?[place] for left-sided weakness/facial droop found to have an acute infarct on MRI involving the R MCA territory and placed on plavix x 3 months , ASA 81mg , and atorvastatin 20mg . SBP on  discharge was 162/60 with a HR of 50. During that admission he was placed on a dysphagia 1 diet d/t difficulty swallowing. Additionally, he had an elevated troponin and rhabdomyolysis with an AKI. Neurology indicated a poor prognosis according to chart review. Of note, palliative care consulted during last admission and patient desired to be full code after much discussion although he has difficulty with memory and has poor insight. He has a sister in [place] and also several nieces that were present in the ED able to answer some questions. Of note, nieces state that he is non-ambulatory and uses a wheelchair.   On arrival in ED, pt was given 4L of NS with improvement in BP. CXR revealed low volumes with interstitial crowing but without PNA. Unable to obtain a urine specimen d/t low output. Bladder scan revealed only about ~17-35cc. Lactic acid was 8.6 with an ABG c/w a metabolic acidosis. Mild leukocytosis of 11,400 and elevated SCr and transaminases. Trop was mildly elevated at 1.09 with an EKG showing Afib with RVR at a HR of 113. BNP mildly elevated at 401. Echo in September showed an EF of ~60%. It is unclear if he received a loop recorder during his previous admission. Rectal  temp was also low at 96.7.   Hospital Course by problem list: Principal Problem:   Sepsis ([place]) Active Problems:   Elevated troponin   AKI (acute kidney injury) ([place])   History of CVA (cerebrovascular accident)   Memory deficit after cerebral infarction   Personal history of prostate cancer   Acute kidney injury [facility])   Palliative care encounter   Dysphagia   Paroxysmal atrial fibrillation (HCC)   DVT, lower extremity, proximal, acute ([place])   Sepsis: Patient presented with hypotension, respiratory distress, hypothermia, and tachycardic irregularly irregular rhythm. UA was suggestive of UTI as cause of his symptoms. Patient was started on Vancomycin and Zosyn. Urine Cultures and Blood Cultures were  negative. Antibiotics were switched to Ceftriaxone only, which was discontinued after 7 total days course of antibiotics were completed.  AKI: Patient with elevated creatinine with peak at 2.9. Likely ATN 2/2 to sepsis/hyovolemia/hypotension. Home Lisinopril-HCTZ was held. Patient was initially with poor urine output. Foley was placed and patient's urine output improved significantly. Creatinine trended down with volume resuscitation and antibiotic treatment with value of 1.65 morning of discharge.  Atrial Fibrillation: Patient arrived with new arrhythmia with Multifocal Atrial Tachycardic appearance. Was later found to be in Atrial Fibrillation with Rapid Ventricular Response. He was started on IV Heparin for anticoagulation and IV Diltiazem for rate control. Patient was transitioned to oral medications when his swallowing function improved to Dysphagia 1. He was started on Coumadin with Lovenox bridge and Diltiazem 30 mg every 6 hours with good rate control.  DVT of LLE: Occlusive DVT noted in left profunda femoral vein and left peroneal veins, no DVT RLE. No CT angio PE study was done because of AKI and no change in management as IV Heparin was already begun. TTE was without evidence of right heart strain.  Dysphagia: Patient with poor swallowing since prior CVA. Swallowing study on 11/10 showed significant risk for aspiration even with thin liquids, and patient was placed on NPO status. Speech Pathology reassessed patient again on 11/15 and advanced his diet to Dysphagia 1. He was able to tolerate pureed food and his oral medications.  Goals of Care: Discussions were had with patient and family by Palliative Care and Internal Medicine teams about goals of care. Patient expressed his desire for aggressive care, including intubation and CPR, should that be necessary. While patient was NPO due to Dysphagia, he declined options for nasogastric or PEG tube feedings. Instead, he preferred to risk aspiration  with soft foods by mouth. Patient's swallowing function improved enough to transition him to Dysphagia 1 diet.  Discharge Vitals:   BP 169/81 mmHg  Pulse 64  Temp(Src) 97.8 F (36.6 C) (Oral)  Resp 17  Ht 5\' 7"  (1.702 m)  Wt 167 lb 3.2 oz (75.841 kg)  BMI 26.18 kg/m2  SpO2 97%  Discharge Labs:  Results for orders placed or performed during the hospital encounter of 02/05/15 (from the past 24 hour(s))  Renal function panel     Status: Abnormal   Collection Time: 02/13/15  4:15 AM  Result Value Ref Range   Sodium 147 (H) 135 - 145 mmol/L   Potassium 3.7 3.5 - 5.1 mmol/L   Chloride 116 (H) 101 - 111 mmol/L   CO2 23 22 - 32 mmol/L   Glucose, Bld 110 (H) 65 - 99 mg/dL   BUN 17 6 - 20 mg/dL   Creatinine, Ser 1.65 (H) 0.61 - 1.24 mg/dL   Calcium 8.4 (L) 8.9 - 10.3 mg/dL  Phosphorus 4.3 2.5 - 4.6 mg/dL   Albumin 2.1 (L) 3.5 - 5.0 g/dL   GFR calc non Af Amer 37 (L) >60 mL/min   GFR calc Af Amer 43 (L) >60 mL/min   Anion gap 8 5 - 15  CBC     Status: Abnormal   Collection Time: 02/13/15  5:00 AM  Result Value Ref Range   WBC 10.6 (H) 4.0 - 10.5 K/uL   RBC 3.60 (L) 4.22 - 5.81 MIL/uL   Hemoglobin 10.8 (L) 13.0 - 17.0 g/dL   HCT 34.6 (L) 39.0 - 52.0 %   MCV 96.1 78.0 - 100.0 fL   MCH 30.0 26.0 - 34.0 pg   MCHC 31.2 30.0 - 36.0 g/dL   RDW 16.6 (H) 11.5 - 15.5 %   Platelets 137 (L) 150 - 400 K/uL  Protime-INR     Status: Abnormal   Collection Time: 02/13/15  5:00 AM  Result Value Ref Range   Prothrombin Time 15.8 (H) 11.6 - 15.2 seconds   INR 1.25 0.00 - 1.49    Signed: Zada Finders, MD 02/13/2015, 2:33 PM    Services Ordered on Discharge: SNF Equipment Ordered on Discharge: none

## 2015-02-13 NOTE — Progress Notes (Signed)
PT Cancellation Note  Patient Details Name: Zachary Leonard MRN: [id number] DOB: Aug 11, 1931   Cancelled Treatment:    Reason Eval/Treat Not Completed: Other (comment) (pt heading to nursing home imminently).  Will defer in anticipation of d/c. 02/13/2015  Donnella Sham, PT [phone]  (pager)   Hiedi Touchton, Tessie Fass 02/13/2015, 5:22 PM

## 2015-02-13 NOTE — Progress Notes (Signed)
Patient ID: Zachary Leonard, male   DOB: January 19, 1932, 81 y.o.   MRN: [id number] Medicine attending: Status reviewed at the bedside with resident physician Zada Finders and we discussed the management plan. Patient is showing daily improvement in his ability to swallow. We gave him sips of clear fluids at the bedside which she was able to tolerate. He is now able to take by mouth medications. He was put back on warfarin yesterday with a Lovenox bridge. Heart rate now controlled on medications. If we can arrange for him to receive Lovenox until warfarin therapeutic at an extended care/rehabilitation facility, he can be safely discharged at this time.

## 2015-02-13 NOTE — Progress Notes (Signed)
Subjective: Patient says he was able to tolerate his soft food well. He asked me about the pathway in which food or liquids can go into his lungs and he seems to understand my explanation. He is feeling well and denies any pain. Objective: Vital signs in last 24 hours: Filed Vitals:   02/12/15 1331 02/12/15 1954 02/12/15 2300 02/13/15 0538  BP: 151/81 157/71  130/75  Pulse: 59 95  63  Temp: 98 F (36.7 C) 98.4 F (36.9 C) 86 F (30 C) 98.1 F (36.7 C)  TempSrc: Axillary Oral  Axillary  Resp: 17 18  16   Height:      Weight:    167 lb 3.2 oz (75.841 kg)  SpO2: 99% 99%  99%   Weight change: 1 lb 8 oz (0.68 kg)  Intake/Output Summary (Last 24 hours) at 02/13/15 1132 Last data filed at 02/13/15 [id number]  Gross per 24 hour  Intake    388 ml  Output   1750 ml  Net  -1362 ml   General: resting in bed Cardiac: irregularly irregular, no murmurs heard Pulm: clear to auscultation anteriorly Abd: soft, nontender, nondistended, BS present Ext: SCDs in place Neuro: alert and oriented x 3   Assessment/Plan: Principal Problem:   Sepsis ([place]) Active Problems:   Elevated troponin   AKI (acute kidney injury) ([place])   History of CVA (cerebrovascular accident)   Memory deficit after cerebral infarction   Personal history of prostate cancer   Acute kidney injury ([place])   Palliative care encounter   Dysphagia   Paroxysmal atrial fibrillation (HCC)   DVT, lower extremity, proximal, acute ([place])  81 y/o male with PMH of Prostate CA s/p XRT, HTN, CVA, and Dysphagia who presented with hypotension, respiratory distress, hypothermia, and tachycardic irregularly irregular rhythm.   Sepsis: Improving. Originally on [place] from 11/9 - 11/12 when switched to Ceftriaxone as blood cultures and urine culture negative. -monitor vitals -blood cultures, urine culture return negative  AKI: Improving, likely ATN from sepsis causing hypotension, was on Ace-I as well.  SCr trending down, 1.65 this  morning.  UOP of 2351 mLs yesterday. -Hold lisinopril and HCTZ -strict I/Os, monitor urine output -appreciate nephro consult   LLE DVT with possible PE - Occlusive DVT noted in left profunda femoral vein and left peroneal veins, no DVT RLE. No CT angio PE study was done because of AKI. TTE without evidence of right heart strain. CT angio not done due to AKI and no change in management if PE was identified.  -On Coumadin w/ lovenox bridge, can discontinue lovenox when INR therapeutic between 2.0-3.0  Acute A. Fib w/ RVR: Patient noted to be in A. Fib with RVR on 11/12 with HR in 130s-140s. Started on Diltiazem gtt. Now on oral Diltiazem with rate controlled <110 and blood pressures stable. -Discontinue Atorvastatin, Aspirin, & Plavix on discharge. There is not much evidence for further benefit at this patient's age, and with his dyshpagia he will at least have some relief with less medications to swallow. -Continue PO Coumadin with Lovenox bridge until INR therapeutic b/w 2.0-3.0 -Continue PO Diltiazem 30 mg q6h  Dysphagia: likely 2/2 to recent stroke. Swallowing study on 11/10 showed significant risk for aspiration even with thin liquids. Speech Pathology reassessed patient again today and have told me he has some improvement with his swallowing function and will be able to start a pureed food diet. This still offers risk for aspiration, but is more reassuring as it is inline  with patient's wishes for oral food and to avoid NG and PEG tubes. -Dysphagia 1 diet as per Speech Pathology -Appreciate Speech Pathology recommendations    Dispo: Planned discharge today to SNF.    The patient does have transportation limitations that hinder transportation to clinic appointments.    LOS: 8 days   Zada Finders, MD 02/13/2015, 11:32 AM

## 2015-02-13 NOTE — NC FL2 (Signed)
[place] LEVEL OF CARE SCREENING TOOL     IDENTIFICATION  Patient Name: Zachary Leonard Birthdate: 07-14-1931 Sex: male Admission Date (Current Location): 02/05/2015  [employer] and [place] Number:     Facility and Address:  [facility] [place] [address] [place], [place] [address]      Provider Number: [id number]  Attending Physician Name and Address:  Aldine Contes, MD  Relative Name and Phone Number:       Current Level of Care: SNF Recommended Level of Care: [place] Prior Approval Number:    Date Approved/Denied:   PASRR Number:    Discharge Plan: SNF    Current Diagnoses: Patient Active Problem List   Diagnosis Date Noted  . Paroxysmal atrial fibrillation (HCC)   . DVT, lower extremity, proximal, acute ([place])   . Palliative care encounter 02/07/2015  . Dysphagia   . Sepsis ([place]) 02/05/2015  . Memory deficit after cerebral infarction 02/05/2015  . Personal history of prostate cancer 02/05/2015  . Acute kidney injury ([place])   . History of CVA (cerebrovascular accident)   . CVA (cerebral infarction) 11/27/2014  . Benign essential HTN 11/27/2014  . Elevated troponin 11/27/2014  . Rhabdomyolysis 11/27/2014  . AKI (acute kidney injury) ([place]) 11/27/2014    Orientation ACTIVITIES/SOCIAL BLADDER RESPIRATION    Self    Incontinent    BEHAVIORAL SYMPTOMS/MOOD NEUROLOGICAL BOWEL NUTRITION STATUS      Incontinent    PHYSICIAN VISITS COMMUNICATION OF NEEDS Height & Weight Skin    Verbally 5\' 7"  (170.2 cm) 167 lbs.            AMBULATORY STATUS RESPIRATION    Assist extensive        Personal Care Assistance Level of Assistance  Bathing, Dressing Bathing Assistance: Limited assistance   Dressing Assistance: Limited assistance      Functional Limitations Info                SPECIAL CARE FACTORS FREQUENCY  PT (By licensed PT), OT (By licensed OT)     PT Frequency: daily OT Frequency: daily           Additional Factors Info  Allergies, Code Status Code Status Info: FULL Allergies Info: NKA           Current Medications (02/13/2015): Current Facility-Administered Medications  Medication Dose Route Frequency Provider Last Rate Last Dose  . acetaminophen (TYLENOL) tablet 500 mg  500 mg Oral Q6H PRN Jones Bales, MD       Or  . acetaminophen (TYLENOL) suppository 325 mg  325 mg Rectal Q6H PRN Jones Bales, MD      . antiseptic oral rinse (CPC / CETYLPYRIDINIUM CHLORIDE 0.05%) solution 7 mL  7 mL Mouth Rinse q12n4p Nischal Narendra, MD   7 mL at 02/12/15 2220  . bisacodyl (DULCOLAX) suppository 10 mg  10 mg Rectal Daily PRN Jones Bales, MD      . chlorhexidine (PERIDEX) 0.12 % solution 15 mL  15 mL Mouth Rinse BID Nischal Narendra, MD   15 mL at 02/13/15 1148  . diltiazem (CARDIZEM) tablet 30 mg  30 mg Oral 4 times per day Shela Leff, MD   30 mg at 02/13/15 1147  . enoxaparin (LOVENOX) injection 75 mg  1 mg/kg Subcutaneous Q12H Wynell Balloon, RPH   75 mg at 02/13/15 1147  . multivitamin with minerals tablet 1 tablet  1 tablet Oral Daily Jones Bales, MD   1  tablet at 02/13/15 1147  . [place]   Oral PRN Aldine Contes, MD      . warfarin (COUMADIN) tablet 5 mg  5 mg Oral ONCE-1800 Wynell Balloon, RPH      . warfarin (COUMADIN) video   Does not apply Once Theone Murdoch Hammons, RPH      . Warfarin - Pharmacist Dosing Inpatient   Does not apply [place], RPH       Do not use this list as official medication orders. Please verify with discharge summary.  Discharge Medications:   Medication List    ASK your doctor about these medications        aspirin EC 81 MG tablet  Take 81 mg by mouth daily.     atorvastatin 20 MG tablet  Commonly known as:  LIPITOR  Take 1 tablet (20 mg total) by mouth daily at 6 PM.     clopidogrel 75 MG tablet  Commonly known as:  PLAVIX  Take 1 tablet (75 mg total) by mouth daily.      lisinopril-hydrochlorothiazide 10-12.5 MG tablet  Commonly known as:  PRINZIDE,ZESTORETIC  Take 1 tablet by mouth daily.     multivitamin with minerals Tabs tablet  Take 1 tablet by mouth daily.        Relevant Imaging Results:  Relevant Lab Results:  Recent Labs    Additional Information    Dulcy Fanny, LCSW

## 2015-02-13 NOTE — Clinical Social Work Note (Signed)
Clinical Social Work Assessment  Patient Details  Name: Zachary Leonard MRN: [id number] Date of Birth: Oct 03, 1931  Date of referral:  02/13/15               Reason for consult:   (From [facility])                Permission sought to share information with:  Facility Art therapist granted to share information::   (patient is not oriented though alert)  Name::        Agency::   ([facility])  Relationship::     Contact Information:     Housing/Transportation Living arrangements for the past 2 months:  Single Family Home, [place] of Information:  Other (Comment Required) (Sibling) Patient Interpreter Needed:  None Criminal Activity/Legal Involvement Pertinent to Current Situation/Hospitalization:  No - Comment as needed Significant Relationships:    Lives with:  Siblings ([name]) Do you feel safe going back to the place where you live?  Yes (SNF level of care) Need for family participation in patient care:  Yes (Comment) (patient is not oriented)  Care giving concerns:  Palliative care involved to assist in addressing sister's concerns re: patient prognosis   Social Worker assessment / plan:  CSW spoke with sister, [name] to complete assessment.  Patient is oriented only intermittently to self.  Palliative care is involved to assist with goals of care and prognosis.  Patient is from [facility] where he will return once medically discharged.  Employment status:  Retired Forensic scientist:    PT Recommendations:  [place] / Referral to community resources:  [place]  Patient/Family's Response to care:  Agreeable to returning to [employer]  Patient/Family's Understanding of and Emotional Response to Diagnosis, Current Treatment, and Prognosis:  Sister is open to discussions regarding goals of care and has been working closely with [place] while  patient in hospital to determine prognosis and goals.    Emotional Assessment Appearance:  Appears stated age Attitude/Demeanor/Rapport:   (appropriate) Affect (typically observed):  Accepting, Adaptable Orientation:  Oriented to Self Alcohol / Substance use:  Not Applicable Psych involvement (Current and /or in the community):  No (Comment)  Discharge Needs  Concerns to be addressed:  No discharge needs identified Readmission within the last 30 days:  No Current discharge risk:  None Barriers to Discharge:  No Barriers Identified   Dulcy Fanny, LCSW 02/13/2015, 2:02 PM

## 2015-02-13 NOTE — Progress Notes (Signed)
ANTICOAGULATION CONSULT NOTE - Follow Up Consult  Pharmacy Consult for Lovenox + Coumadin Indication: atrial fibrillation and DVT  No Known Allergies  Patient Measurements: Height: 5\' 7"  (170.2 cm) (estimated) Weight: 167 lb 3.2 oz (75.841 kg) IBW/kg (Calculated) : 66.1  Vital Signs: Temp: 98.1 F (36.7 C) (11/17 0538) Temp Source: Axillary (11/17 0538) BP: 130/75 mmHg (11/17 0538) Pulse Rate: 63 (11/17 0538)  Labs:  Recent Labs  02/10/15 1130 02/11/15 0710 02/12/15 0251 02/12/15 1250 02/13/15 0415 02/13/15 0500  HGB 9.9* 10.5* 10.8*  --   --  10.8*  HCT 30.4* 31.8* 34.6*  --   --  34.6*  PLT 181 162 146*  --   --  137*  LABPROT  --   --   --  15.0  --  15.8*  INR  --   --   --  1.16  --  1.25  HEPARINUNFRC 0.57 0.Zachary 0.39  --   --   --   CREATININE 2.22* 2.04* 1.89*  --  1.65*  --     Estimated Creatinine Clearance: 32.8 mL/min (by C-G formula based on Cr of 1.65).   Assessment: Zachary Leonard with SOB.   PMH: prostate CA s/p radiation, CVA, HTN, NSTEMI  Anticoagulation: Enox/warfarin for new LLE DVT and Afib. High risk for PE w/ acute dyspnea and bedbound but cannot get CT angio d/t AKI currently.   11/16 D1 INR 1.16, Warfarin 5 mg 11/17 D2 INR 1.25, Warfarin 5 mg  Cardiovascular: Afib. ACE held 2nd AKI.  [place] q6  Nephrology: AKI, SCr tending better, now 1.65 (from > 2)  Hematology / Oncology: prostate cx s/p xrt. H&H 10.8/34.6, Plt 137  Goal of Therapy:  INR 2-3 Anti-Xa level 0.6-1 units/ml 4hrs after LMWH dose given Monitor platelets by anticoagulation protocol: Yes   Plan:  - Coumadin 5 mg PO x 1 tonight - Adjust enoxaparin 75 mg to q12h (may need to change back if renal fx worsens again) - Daily INR, CBC q72h - Monitor for s/sx of bleeding - F/U restart of atorvastatin, clopidogrel, and aspirin  Levester Fresh, PharmD, BCPS Clinical Pharmacist Pager [phone] 02/13/2015 8:47 AM

## 2015-09-24 ENCOUNTER — Emergency Department (HOSPITAL_COMMUNITY)
Admission: EM | Admit: 2015-09-24 | Discharge: 2015-09-24 | Disposition: A | Discharge status: Home / Self Care | Payer: Medicare Other | Attending: Emergency Medicine | Admitting: Emergency Medicine

## 2015-09-24 ENCOUNTER — Emergency Department (HOSPITAL_COMMUNITY): Payer: Medicare Other

## 2015-09-24 ENCOUNTER — Encounter (HOSPITAL_COMMUNITY): Payer: Self-pay | Admitting: Emergency Medicine

## 2015-09-24 DIAGNOSIS — Y929 Unspecified place or not applicable: Secondary | ICD-10-CM | POA: Diagnosis not present

## 2015-09-24 DIAGNOSIS — Y999 Unspecified external cause status: Secondary | ICD-10-CM | POA: Diagnosis not present

## 2015-09-24 DIAGNOSIS — S0181XA Laceration without foreign body of other part of head, initial encounter: Secondary | ICD-10-CM | POA: Insufficient documentation

## 2015-09-24 DIAGNOSIS — I10 Essential (primary) hypertension: Secondary | ICD-10-CM | POA: Insufficient documentation

## 2015-09-24 DIAGNOSIS — W06XXXA Fall from bed, initial encounter: Secondary | ICD-10-CM | POA: Insufficient documentation

## 2015-09-24 DIAGNOSIS — Y939 Activity, unspecified: Secondary | ICD-10-CM | POA: Insufficient documentation

## 2015-09-24 DIAGNOSIS — G9389 Other specified disorders of brain: Secondary | ICD-10-CM | POA: Diagnosis not present

## 2015-09-24 DIAGNOSIS — Z8546 Personal history of malignant neoplasm of prostate: Secondary | ICD-10-CM | POA: Diagnosis not present

## 2015-09-24 DIAGNOSIS — Z8673 Personal history of transient ischemic attack (TIA), and cerebral infarction without residual deficits: Secondary | ICD-10-CM | POA: Diagnosis not present

## 2015-09-24 DIAGNOSIS — W19XXXA Unspecified fall, initial encounter: Secondary | ICD-10-CM

## 2015-09-24 HISTORY — DX: Cerebral infarction, unspecified: I63.9

## 2015-09-24 MED ORDER — TETANUS-DIPHTH-ACELL PERTUSSIS 5-2.5-18.5 LF-MCG/0.5 IM SUSP
0.5000 mL | Freq: Once | INTRAMUSCULAR | Status: AC
  Administered 2015-09-24: 0.5 mL via INTRAMUSCULAR
  Filled 2015-09-24: qty 0.5

## 2015-09-24 NOTE — ED Notes (Signed)
Per EMS, pt from [facility] with c/o fall from bed. Pt has a laceration to the forehead, and takes xarelto. Pt has a hx of CVA, with left sided deficits. Per EMS, no new neuro deficits. CBG-109, BP-148/76, RR-16, HR-58, SpO2-99% RA. Pt denies pain.

## 2015-09-24 NOTE — ED Provider Notes (Signed)
CSN: [id number]     Arrival date & time 09/24/15  0319 History  By signing my name below, I, Arianna Nassar, attest that this documentation has been prepared under the direction and in the presence of Everlene Balls, MD.  Electronically Signed: Julien Nordmann, ED Scribe. 09/24/2015. 4:00 AM.    Chief Complaint  Patient presents with  . Fall      The history is provided by the patient and the EMS personnel. No language interpreter was used.   HPI Comments: Zachary Leonard is a 82 y.o. male brought in by ambulance, who has a PMhx of prostate cancer, HTN, stroke presents to the Emergency Department complaining a left forehead laceration s/p a fall that occurred PTA. Pt is living at [facility] facility where he rolled over and fell out of bed this evening. When he fell, pt hit his head causing a laceration on his left forehead. Pt has left sided deficits at baseline due to prior CVA's. Pt is currently on Xarelto. He denies neck pain or any other complaints.   Past Medical History  Diagnosis Date  . Cancer [facility])     prostate, treated with XRT  . HTN (hypertension)   . Stroke [facility])    Past Surgical History  Procedure Laterality Date  . No past surgeries     Family History  Problem Relation Age of Onset  . Hypertension     Social History  Substance Use Topics  . Smoking status: Never Smoker   . Smokeless tobacco: None  . Alcohol Use: No     Comment: quit 10-12 years ago, prior drank 1 glass of wine daily    Review of Systems  A complete 10 system review of systems was obtained and all systems are negative except as noted in the HPI and PMH.    Allergies  Review of patient's allergies indicates no known allergies.  Home Medications   Prior to Admission medications   Medication Sig Start Date End Date Taking? Authorizing Provider  diltiazem (CARDIZEM) 30 MG tablet Take 1 tablet (30 mg total) by mouth every 6 (six) hours. 02/13/15   Zada Finders, MD  enoxaparin (LOVENOX) 150  MG/ML injection Inject 0.5 mLs (75 mg total) into the skin every 12 (twelve) hours. 02/13/15   Zada Finders, MD  Multiple Vitamin (MULTIVITAMIN WITH MINERALS) TABS tablet Take 1 tablet by mouth daily.    Historical Provider, MD  warfarin (COUMADIN) 5 MG tablet Take 1 tablet (5 mg total) by mouth one time only at 6 PM. 02/13/15   Zada Finders, MD   BP 149/73 mmHg  Pulse 57  Temp(Src) 98.9 F (37.2 C) (Oral)  Resp 19  SpO2 99% Physical Exam  Constitutional: He is oriented to person, place, and time. Vital signs are normal. He appears well-developed and well-nourished.  Non-toxic appearance. He does not appear ill. No distress.  HENT:  Head: Normocephalic.  Nose: Nose normal.  Mouth/Throat: Oropharynx is clear and moist. No oropharyngeal exudate.  1 cm forehead laceration no active bleeding Abrasions to the right cheek  Eyes: Conjunctivae and EOM are normal. Pupils are equal, round, and reactive to light. No scleral icterus.  Neck: Normal range of motion. Neck supple. No tracheal deviation, no edema, no erythema and normal range of motion present. No thyroid mass and no thyromegaly present.  Cardiovascular: Regular rhythm, S1 normal, S2 normal, normal heart sounds, intact distal pulses and normal pulses.  Bradycardia present.  Exam reveals no gallop and no friction rub.  No murmur heard. Pulmonary/Chest: Effort normal and breath sounds normal. No respiratory distress. He has no wheezes. He has no rhonchi. He has no rales.  Abdominal: Soft. Normal appearance and bowel sounds are normal. He exhibits no distension, no ascites and no mass. There is no hepatosplenomegaly. There is no tenderness. There is no rebound, no guarding and no CVA tenderness.  Musculoskeletal: Normal range of motion. He exhibits no edema or tenderness.  0/5 strength upper and lower extremities 5/5 strength in upper and lower extremities  Lymphadenopathy:    He has no cervical adenopathy.  Neurological: He is alert and  oriented to person, place, and time. He has normal strength. No cranial nerve deficit or sensory deficit.  Skin: Skin is warm, dry and intact. No petechiae and no rash noted. He is not diaphoretic. No erythema. No pallor.  Psychiatric: He has a normal mood and affect. His behavior is normal. Judgment normal.  Nursing note and vitals reviewed.   ED Course  Procedures  DIAGNOSTIC STUDIES: Oxygen Saturation is 99% on RA, normal by my interpretation.  COORDINATION OF CARE:  4:00 AM Will order CT head. Discussed treatment plan which includes dermabond laceration with pt at bedside and pt agreed to plan.  Labs Review Labs Reviewed - No data to display  Imaging Review Ct Head Wo Contrast  09/24/2015  CLINICAL DATA:  [place].  Anti coagulated. EXAM: CT HEAD WITHOUT CONTRAST CT CERVICAL SPINE WITHOUT CONTRAST TECHNIQUE: Multidetector CT imaging of the head and cervical spine was performed following the standard protocol without intravenous contrast. Multiplanar CT image reconstructions of the cervical spine were also generated. COMPARISON:  11/28/2014 FINDINGS: CT HEAD FINDINGS There is encephalomalacia due to right MCA infarction, remote. There is no intracranial hemorrhage or extra-axial fluid collection. There is no evidence of acute infarction. There is moderate generalized atrophy. Calvarium and skullbase are intact. CT CERVICAL SPINE FINDINGS The vertebral column, pedicles and facet articulations are intact. There is no evidence of acute fracture. No acute soft tissue abnormalities are evident. Extensive severe degenerative changes are present from C3 through C7. IMPRESSION: Negative for acute intracranial hemorrhage or other acute finding. Remote right MCA infarction with encephalomalacia. Negative for acute cervical spine fracture. Electronically Signed   By: Andreas Newport M.D.   On: 09/24/2015 06:23   Ct Cervical Spine Wo Contrast  09/24/2015  CLINICAL DATA:  [place].  Anti coagulated. EXAM: CT  HEAD WITHOUT CONTRAST CT CERVICAL SPINE WITHOUT CONTRAST TECHNIQUE: Multidetector CT imaging of the head and cervical spine was performed following the standard protocol without intravenous contrast. Multiplanar CT image reconstructions of the cervical spine were also generated. COMPARISON:  11/28/2014 FINDINGS: CT HEAD FINDINGS There is encephalomalacia due to right MCA infarction, remote. There is no intracranial hemorrhage or extra-axial fluid collection. There is no evidence of acute infarction. There is moderate generalized atrophy. Calvarium and skullbase are intact. CT CERVICAL SPINE FINDINGS The vertebral column, pedicles and facet articulations are intact. There is no evidence of acute fracture. No acute soft tissue abnormalities are evident. Extensive severe degenerative changes are present from C3 through C7. IMPRESSION: Negative for acute intracranial hemorrhage or other acute finding. Remote right MCA infarction with encephalomalacia. Negative for acute cervical spine fracture. Electronically Signed   By: Andreas Newport M.D.   On: 09/24/2015 06:23   I have personally reviewed and evaluated these images and lab results as part of my medical decision-making.   EKG Interpretation None      MDM   Final diagnoses:  None   Patient presents to the ED for a fall and laceration in his forehead.  This was purely a mechanical fall per history.  Lac was repaired with dermabond.  Will obtain CT head and neck for trauma evaluation.  Neuro exam is normal   6:47 AM CT normal for any injury.  Neuro exam remains normal.  Patietn safe for DC with PCP fu within3  Days.    LACERATION REPAIR Performed by: Everlene Balls Authorized byEverlene Balls Consent: Verbal consent obtained. Risks and benefits: risks, benefits and alternatives were discussed Consent given by: patient Patient identity confirmed: provided demographic data Prepped and Draped in normal sterile fashion Wound  explored  Laceration Location: forehead  Laceration Length: 1cm  No Foreign Bodies seen or palpated   Irrigation method: syringe Amount of cleaning: standard  Skin closure: dermabond   Patient tolerance: Patient tolerated the procedure well with no immediate complications.    I personally performed the services described in this documentation, which was scribed in my presence. The recorded information has been reviewed and is accurate.     Everlene Balls, MD 09/24/15 [phone]

## 2015-09-24 NOTE — Discharge Instructions (Signed)
Fall Prevention in the Home  Zachary Leonard, your CT scan does not show any injuries.  See a primary care doctor within 3 days for close follow up.  If any symptoms worsen, come back to the ED immediately. Thank you. Falls can cause injuries. They can happen to people of all ages. There are many things you can do to make your home safe and to help prevent falls.  WHAT CAN I DO ON THE OUTSIDE OF MY HOME?  Regularly fix the edges of walkways and driveways and fix any cracks.  Remove anything that might make you trip as you walk through a door, such as a raised step or threshold.  Trim any bushes or trees on the path to your home.  Use bright outdoor lighting.  Clear any walking paths of anything that might make someone trip, such as rocks or tools.  Regularly check to see if handrails are loose or broken. Make sure that both sides of any steps have handrails.  Any raised decks and porches should have guardrails on the edges.  Have any leaves, snow, or ice cleared regularly.  Use sand or salt on walking paths during winter.  Clean up any spills in your garage right away. This includes oil or grease spills. WHAT CAN I DO IN THE BATHROOM?   Use night lights.  Install grab bars by the toilet and in the tub and shower. Do not use towel bars as grab bars.  Use non-skid mats or decals in the tub or shower.  If you need to sit down in the shower, use a plastic, non-slip stool.  Keep the floor dry. Clean up any water that spills on the floor as soon as it happens.  Remove soap buildup in the tub or shower regularly.  Attach bath mats securely with double-sided non-slip rug tape.  Do not have throw rugs and other things on the floor that can make you trip. WHAT CAN I DO IN THE BEDROOM?  Use night lights.  Make sure that you have a light by your bed that is easy to reach.  Do not use any sheets or blankets that are too big for your bed. They should not hang down onto the floor.  Have a  firm chair that has side arms. You can use this for support while you get dressed.  Do not have throw rugs and other things on the floor that can make you trip. WHAT CAN I DO IN THE KITCHEN?  Clean up any spills right away.  Avoid walking on wet floors.  Keep items that you use a lot in easy-to-reach places.  If you need to reach something above you, use a strong step stool that has a grab bar.  Keep electrical cords out of the way.  Do not use floor polish or wax that makes floors slippery. If you must use wax, use non-skid floor wax.  Do not have throw rugs and other things on the floor that can make you trip. WHAT CAN I DO WITH MY STAIRS?  Do not leave any items on the stairs.  Make sure that there are handrails on both sides of the stairs and use them. Fix handrails that are broken or loose. Make sure that handrails are as long as the stairways.  Check any carpeting to make sure that it is firmly attached to the stairs. Fix any carpet that is loose or worn.  Avoid having throw rugs at the top or bottom of the  stairs. If you do have throw rugs, attach them to the floor with carpet tape.  Make sure that you have a light switch at the top of the stairs and the bottom of the stairs. If you do not have them, ask someone to add them for you. WHAT ELSE CAN I DO TO HELP PREVENT FALLS?  Wear shoes that:  Do not have high heels.  Have rubber bottoms.  Are comfortable and fit you well.  Are closed at the toe. Do not wear sandals.  If you use a stepladder:  Make sure that it is fully opened. Do not climb a closed stepladder.  Make sure that both sides of the stepladder are locked into place.  Ask someone to hold it for you, if possible.  Clearly mark and make sure that you can see:  Any grab bars or handrails.  First and last steps.  Where the edge of each step is.  Use tools that help you move around (mobility aids) if they are needed. These  include:  Canes.  Walkers.  Scooters.  Crutches.  Turn on the lights when you go into a dark area. Replace any light bulbs as soon as they burn out.  Set up your furniture so you have a clear path. Avoid moving your furniture around.  If any of your floors are uneven, fix them.  If there are any pets around you, be aware of where they are.  Review your medicines with your doctor. Some medicines can make you feel dizzy. This can increase your chance of falling. Ask your doctor what other things that you can do to help prevent falls.   This information is not intended to replace advice given to you by your health care provider. Make sure you discuss any questions you have with your health care provider.   Document Released: 01/09/2009 Document Revised: 07/30/2014 Document Reviewed: 04/19/2014 Elsevier Interactive Patient Education 2016 [place]. Tissue Adhesive Wound Care Some cuts and wounds can be closed with tissue adhesive. Adhesive is like glue. It holds the skin together and helps a wound heal faster. This adhesive goes away on its own as the wound heals.  HOME CARE   Showers are allowed. Do not soak the wound in water. Do not take baths, swim, or use hot tubs. Do not use soaps or creams on your wound.  If a bandage (dressing) was put on, change it as often as told by your doctor.  Keep the bandage dry.  Do not scratch, pick, or rub the adhesive.  Do not put tape over the adhesive. The adhesive could come off.  Protect the wound from another injury.  Protect the wound from sun and tanning beds.  Only take medicine as told by your doctor.  Keep all doctor visits as told. GET HELP RIGHT AWAY IF:   Your wound is red, puffy (swollen), hot, or tender.  You get a rash after the glue is put on.  You have more pain in the wound.  You have a red streak going away from the wound.  You have yellowish-white fluid (pus) coming from the wound.  You have more  bleeding.  You have a fever.  You have chills and start to shake.  You notice a bad smell coming from the wound.  Your wound or adhesive breaks open. MAKE SURE YOU:   Understand these instructions.  Will watch your condition.  Will get help right away if you are not doing well or get  worse.   This information is not intended to replace advice given to you by your health care provider. Make sure you discuss any questions you have with your health care provider.   Document Released: 12/23/2007 Document Revised: 01/03/2013 Document Reviewed: 10/04/2012 Elsevier Interactive Patient Education [employer].

## 2015-12-13 ENCOUNTER — Encounter (HOSPITAL_COMMUNITY): Payer: Self-pay

## 2015-12-13 ENCOUNTER — Emergency Department (HOSPITAL_COMMUNITY): Payer: Medicare Other

## 2015-12-13 ENCOUNTER — Inpatient Hospital Stay (HOSPITAL_COMMUNITY)
Admission: EM | Admit: 2015-12-13 | Discharge: 2015-12-28 | DRG: 871 | Disposition: E | Payer: Medicare Other | Attending: Internal Medicine | Admitting: Internal Medicine

## 2015-12-13 DIAGNOSIS — R627 Adult failure to thrive: Secondary | ICD-10-CM | POA: Diagnosis present

## 2015-12-13 DIAGNOSIS — I480 Paroxysmal atrial fibrillation: Secondary | ICD-10-CM | POA: Diagnosis present

## 2015-12-13 DIAGNOSIS — Z66 Do not resuscitate: Secondary | ICD-10-CM | POA: Diagnosis present

## 2015-12-13 DIAGNOSIS — E870 Hyperosmolality and hypernatremia: Secondary | ICD-10-CM | POA: Diagnosis present

## 2015-12-13 DIAGNOSIS — R569 Unspecified convulsions: Secondary | ICD-10-CM

## 2015-12-13 DIAGNOSIS — I69354 Hemiplegia and hemiparesis following cerebral infarction affecting left non-dominant side: Secondary | ICD-10-CM

## 2015-12-13 DIAGNOSIS — Z681 Body mass index (BMI) 19 or less, adult: Secondary | ICD-10-CM

## 2015-12-13 DIAGNOSIS — Z993 Dependence on wheelchair: Secondary | ICD-10-CM | POA: Diagnosis not present

## 2015-12-13 DIAGNOSIS — J690 Pneumonitis due to inhalation of food and vomit: Secondary | ICD-10-CM | POA: Diagnosis not present

## 2015-12-13 DIAGNOSIS — R6521 Severe sepsis with septic shock: Secondary | ICD-10-CM | POA: Diagnosis present

## 2015-12-13 DIAGNOSIS — A419 Sepsis, unspecified organism: Principal | ICD-10-CM

## 2015-12-13 DIAGNOSIS — R400 Somnolence: Secondary | ICD-10-CM

## 2015-12-13 DIAGNOSIS — Z515 Encounter for palliative care: Secondary | ICD-10-CM

## 2015-12-13 DIAGNOSIS — G9341 Metabolic encephalopathy: Secondary | ICD-10-CM | POA: Diagnosis present

## 2015-12-13 DIAGNOSIS — Z79899 Other long term (current) drug therapy: Secondary | ICD-10-CM | POA: Diagnosis not present

## 2015-12-13 DIAGNOSIS — Z923 Personal history of irradiation: Secondary | ICD-10-CM | POA: Diagnosis not present

## 2015-12-13 DIAGNOSIS — Z8546 Personal history of malignant neoplasm of prostate: Secondary | ICD-10-CM

## 2015-12-13 DIAGNOSIS — R1310 Dysphagia, unspecified: Secondary | ICD-10-CM

## 2015-12-13 DIAGNOSIS — I10 Essential (primary) hypertension: Secondary | ICD-10-CM | POA: Diagnosis present

## 2015-12-13 DIAGNOSIS — L8990 Pressure ulcer of unspecified site, unspecified stage: Secondary | ICD-10-CM | POA: Diagnosis not present

## 2015-12-13 DIAGNOSIS — E43 Unspecified severe protein-calorie malnutrition: Secondary | ICD-10-CM | POA: Diagnosis present

## 2015-12-13 DIAGNOSIS — G9340 Encephalopathy, unspecified: Secondary | ICD-10-CM

## 2015-12-13 DIAGNOSIS — I129 Hypertensive chronic kidney disease with stage 1 through stage 4 chronic kidney disease, or unspecified chronic kidney disease: Secondary | ICD-10-CM | POA: Diagnosis present

## 2015-12-13 DIAGNOSIS — N183 Chronic kidney disease, stage 3 (moderate): Secondary | ICD-10-CM | POA: Diagnosis present

## 2015-12-13 DIAGNOSIS — L89154 Pressure ulcer of sacral region, stage 4: Secondary | ICD-10-CM | POA: Diagnosis not present

## 2015-12-13 DIAGNOSIS — Z8673 Personal history of transient ischemic attack (TIA), and cerebral infarction without residual deficits: Secondary | ICD-10-CM

## 2015-12-13 DIAGNOSIS — Z638 Other specified problems related to primary support group: Secondary | ICD-10-CM

## 2015-12-13 DIAGNOSIS — Z7901 Long term (current) use of anticoagulants: Secondary | ICD-10-CM | POA: Diagnosis not present

## 2015-12-13 DIAGNOSIS — Z8249 Family history of ischemic heart disease and other diseases of the circulatory system: Secondary | ICD-10-CM

## 2015-12-13 DIAGNOSIS — I69322 Dysarthria following cerebral infarction: Secondary | ICD-10-CM | POA: Diagnosis not present

## 2015-12-13 HISTORY — DX: Cerebral infarction, unspecified: I63.9

## 2015-12-13 LAB — URINALYSIS, ROUTINE W REFLEX MICROSCOPIC
GLUCOSE, UA: NEGATIVE mg/dL
HGB URINE DIPSTICK: NEGATIVE
KETONES UR: NEGATIVE mg/dL
Nitrite: NEGATIVE
PROTEIN: 30 mg/dL — AB
Specific Gravity, Urine: 1.021 (ref 1.005–1.030)
pH: 5.5 (ref 5.0–8.0)

## 2015-12-13 LAB — COMPREHENSIVE METABOLIC PANEL
ALBUMIN: 1.9 g/dL — AB (ref 3.5–5.0)
ALK PHOS: 78 U/L (ref 38–126)
ALT: 12 U/L — AB (ref 17–63)
ANION GAP: 9 (ref 5–15)
AST: 21 U/L (ref 15–41)
BILIRUBIN TOTAL: 0.5 mg/dL (ref 0.3–1.2)
BUN: 42 mg/dL — ABNORMAL HIGH (ref 6–20)
CALCIUM: 9 mg/dL (ref 8.9–10.3)
CO2: 28 mmol/L (ref 22–32)
CREATININE: 1.41 mg/dL — AB (ref 0.61–1.24)
Chloride: 115 mmol/L — ABNORMAL HIGH (ref 101–111)
GFR calc Af Amer: 52 mL/min — ABNORMAL LOW (ref >60)
GFR calc non Af Amer: 45 mL/min — ABNORMAL LOW (ref >60)
GLUCOSE: 134 mg/dL — AB (ref 65–99)
Potassium: 4 mmol/L (ref 3.5–5.1)
Sodium: 152 mmol/L — ABNORMAL HIGH (ref 135–145)
TOTAL PROTEIN: 6.2 g/dL — AB (ref 6.5–8.1)

## 2015-12-13 LAB — I-STAT ARTERIAL BLOOD GAS, ED
ACID-BASE EXCESS: 3 mmol/L — AB (ref 0.0–2.0)
BICARBONATE: 27.4 mmol/L (ref 20.0–28.0)
O2 SAT: 90 %
PCO2 ART: 41 mmHg (ref 32.0–48.0)
PH ART: 7.431 (ref 7.350–7.450)
PO2 ART: 56 mmHg — AB (ref 83.0–108.0)
Patient temperature: 97.8
TCO2: 29 mmol/L (ref 0–100)

## 2015-12-13 LAB — CBC WITH DIFFERENTIAL/PLATELET
Basophils Absolute: 0 10*3/uL (ref 0.0–0.1)
Basophils Relative: 0 %
Eosinophils Absolute: 0 10*3/uL (ref 0.0–0.7)
Eosinophils Relative: 0 %
HEMATOCRIT: 31.8 % — AB (ref 39.0–52.0)
HEMOGLOBIN: 9.6 g/dL — AB (ref 13.0–17.0)
Lymphocytes Relative: 7 %
Lymphs Abs: 2.3 10*3/uL (ref 0.7–4.0)
MCH: 29.1 pg (ref 26.0–34.0)
MCHC: 30.2 g/dL (ref 30.0–36.0)
MCV: 96.4 fL (ref 78.0–100.0)
Monocytes Absolute: 2 10*3/uL — ABNORMAL HIGH (ref 0.1–1.0)
Monocytes Relative: 6 %
NEUTROS ABS: 28.6 10*3/uL — AB (ref 1.7–7.7)
Neutrophils Relative %: 87 %
Platelets: 354 10*3/uL (ref 150–400)
RBC: 3.3 MIL/uL — ABNORMAL LOW (ref 4.22–5.81)
RDW: 16.3 % — ABNORMAL HIGH (ref 11.5–15.5)
WBC: 32.9 10*3/uL — ABNORMAL HIGH (ref 4.0–10.5)

## 2015-12-13 LAB — I-STAT CHEM 8, ED
BUN: 39 mg/dL — AB (ref 6–20)
CALCIUM ION: 1.14 mmol/L — AB (ref 1.15–1.40)
CHLORIDE: 113 mmol/L — AB (ref 101–111)
CREATININE: 1.4 mg/dL — AB (ref 0.61–1.24)
GLUCOSE: 125 mg/dL — AB (ref 65–99)
HCT: 30 % — ABNORMAL LOW (ref 39.0–52.0)
Hemoglobin: 10.2 g/dL — ABNORMAL LOW (ref 13.0–17.0)
Potassium: 3.9 mmol/L (ref 3.5–5.1)
Sodium: 153 mmol/L — ABNORMAL HIGH (ref 135–145)
TCO2: 28 mmol/L (ref 0–100)

## 2015-12-13 LAB — URINE MICROSCOPIC-ADD ON: RBC / HPF: NONE SEEN RBC/hpf (ref 0–5)

## 2015-12-13 LAB — I-STAT TROPONIN, ED: Troponin i, poc: 0.02 ng/mL (ref 0.00–0.08)

## 2015-12-13 LAB — CBG MONITORING, ED: GLUCOSE-CAPILLARY: 127 mg/dL — AB (ref 65–99)

## 2015-12-13 LAB — I-STAT CG4 LACTIC ACID, ED: Lactic Acid, Venous: 3.6 mmol/L (ref 0.5–1.9)

## 2015-12-13 MED ORDER — VANCOMYCIN HCL IN DEXTROSE 750-5 MG/150ML-% IV SOLN
750.0000 mg | INTRAVENOUS | Status: DC
  Administered 2015-12-13: 750 mg via INTRAVENOUS
  Filled 2015-12-13 (×3): qty 150

## 2015-12-13 MED ORDER — SODIUM CHLORIDE 0.9 % IV BOLUS (SEPSIS)
2000.0000 mL | Freq: Once | INTRAVENOUS | Status: AC
  Administered 2015-12-13: 2000 mL via INTRAVENOUS

## 2015-12-13 MED ORDER — SODIUM CHLORIDE 0.9 % IV BOLUS (SEPSIS)
1500.0000 mL | Freq: Once | INTRAVENOUS | Status: AC
  Administered 2015-12-13: 1500 mL via INTRAVENOUS

## 2015-12-13 MED ORDER — DEXTROSE 5 % IV SOLN
2.0000 g | Freq: Once | INTRAVENOUS | Status: AC
  Administered 2015-12-13: 2 g via INTRAVENOUS
  Filled 2015-12-13: qty 2

## 2015-12-13 NOTE — ED Notes (Signed)
Pt has large wound on buttock with deep tunneling, new wet to dry dressing applied with packing, pt has another wound on bottom stage 2 new dressing applied, pt also has wound on R elbow and three wounds on r foot. Pt family at bedside, reports that pt is normally none verbal, and this is his normal state.

## 2015-12-13 NOTE — ED Notes (Signed)
Notified edp critical istat results

## 2015-12-13 NOTE — ED Notes (Signed)
Admitting at bedside 

## 2015-12-13 NOTE — Progress Notes (Addendum)
Pharmacy Antibiotic Note  Zachary Leonard is a 82 y.o. male admitted on 12/02/2015 after found unresponsive at [facility]. .  Pharmacy has been consulted for Vancomycin dosing for pneumonia.   WBC 32.9, Temp 97.8, LA 3.6  SCr 1.4 , CrCl ~ 28.7 ml/min  Plan: Vancomycin 750 mg IV q24h  Monitor clinical status, renal function and culture results daily.   Height: 5\' 6"  (167.6 cm) Weight: 110 lb (49.9 kg) IBW/kg (Calculated) : 63.8  Temp (24hrs), Avg:97.8 F (36.6 C), Min:97.8 F (36.6 C), Max:97.8 F (36.6 C)   Recent Labs Lab 12/21/2015 1925 11/29/2015 1942 12/15/2015 1943  WBC 32.9*  --   --   CREATININE 1.41*  --  1.40*  LATICACIDVEN  --  3.60*  --     Estimated Creatinine Clearance: 28.7 mL/min (by C-G formula based on SCr of 1.4 mg/dL (H)).    No Known Allergies  Antimicrobials this admission: 9/16 Cefepime x1 9/16 Vancomycin  >>    Dose adjustments this admission:   Microbiology results: 9/16 BCx: sent 9/16 UCx: sent  Thank you for allowing pharmacy to be a part of this patient's care. Nicole Cella, RPh Clinical Pharmacist Pager: [phone] 12/27/2015 8:54 PM

## 2015-12-13 NOTE — ED Triage Notes (Signed)
Pt comes from [place] via [facility], pt is a DNR, pt was found unresponsive since around 3pm, pt is normally talkative and responsive. Per staff pt vomited once today, LSN yesterday. BBB on EKG

## 2015-12-13 NOTE — H&P (Signed)
History and Physical  Patient Name: Zachary Leonard     [id number]    DOB: September 04, 1931    DOA: 12/21/2015 PCP: PROVIDER NOT IN SYSTEM   Patient coming from: [facility] via EMS   Chief Complaint: Lethargy, unresponsive  HPI: Zachary Leonard is a 82 y.o. male with a past medical history significant for history of CVA with residual L sided weakness, dysarthria, hx of Afib on Xarelto, and severe PCM who presents with lethargy.  All history is collected from the patient's sister at the bedside, as the patient is unresponsive.  She describes that the patient was independent, lived by himself, and still drove his car before his stroke last year. Following his stroke, he had complete paralysis on the left side and was wheelchair-bound, had dysarthria and difficulty swallowing, was dependent for all ADLs, and moved into a nursing home. She notes that he has lost a significant amount of weight over the last 12 months, and that even more so in the last month he seemed depressed, "like he just wanted to be let go", had verbal outbursts that were uncharacteristic of him.  Then in the last 2 weeks he has had hardly anything to eat, to the point that his nursing home had to give him IV fluids once last week. This morning, the patient was his normal self, but in the afternoon was found by staff unresponsive and lethargic, and EMS were called.  There was no preceding cough that had been noted.  He does not have an indwelling foley.  He has an old sacral ulcer.  ED course: -Afebrile, heart rate 130s, respirations 30s and 40s, BP 89/54 mmHg and pO2 56 by ABG -Na 152, K 4.0, Cr 1.4 (baseline 1.6), WBC 32.9K, Hgb 9.6 (baseline 10.8) -Lactic acid 3.6 -CXR showed ? Opacity in the left base, CT head showed only his old R MCA infarct -UA showed bacteria and casts -Blood and urine cultures were obtained, empiric broad-spectrum antibiotics were delivered, at 30 mL/kg bolus was given, and TRH were asked to evaluate for  admission     ROS: Review of Systems  Unable to perform ROS: Patient unresponsive          Past Medical History:  Diagnosis Date  . Cancer [facility])    prostate, treated with XRT  . HTN (hypertension)   . Stroke [facility])     Past Surgical History:  Procedure Laterality Date  . NO PAST SURGERIES      Social History: Patient lives at a SNF.  The patient is wheelchair bound.  He is from [place], nonsmoker.  Has one daughter, with schizophrenia, from whom he is estranged.  Has no formal POA, medical decisions are made by the patient and his sister.    Not on File  Family history: Unable to obtain due to patient mentation.  Report of hypertension in an unspecified family member.  Prior to Admission medications   Medication Sig Start Date End Date Taking? Authorizing Provider  Amino Acids-Protein Hydrolys (PRO-STAT AWC) LIQD Take 30 mLs by mouth 2 (two) times daily.   Yes Historical Provider, MD  clonazePAM (KLONOPIN) 0.5 MG tablet Take 0.5 mg by mouth every 12 (twelve) hours as needed for anxiety.   Yes Historical Provider, MD  diltiazem (CARDIZEM) 30 MG tablet Take 1 tablet (30 mg total) by mouth every 6 (six) hours. Patient taking differently: Take 30 mg by mouth 4 (four) times daily.  02/13/15  Yes Zada Finders, MD  Eslicarbazepine Acetate (APTIOM) 400  MG TABS Take 1 tablet by mouth daily.   Yes Historical Provider, MD  LORazepam (ATIVAN) 0.5 MG tablet Take 0.5 mg by mouth every 8 (eight) hours as needed (for shaking).    Yes Historical Provider, MD  oxyCODONE-acetaminophen (PERCOCET) 5-325 MG tablet Take 1 tablet by mouth every 8 (eight) hours as needed (for pain).   Yes Historical Provider, MD  propranolol (INDERAL) 20 MG tablet Take 20 mg by mouth 2 (two) times daily.   Yes Historical Provider, MD  rivaroxaban (XARELTO) 20 MG TABS tablet Take 20 mg by mouth daily with supper.   Yes Historical Provider, MD  sertraline (ZOLOFT) 25 MG tablet Take 25 mg by mouth every morning.    Yes Historical Provider, MD  UNABLE TO FIND Med Plus 2.0: Drink 240 ml's by mouth three times a day for nutritional support   Yes Historical Provider, MD  collagenase (SANTYL) ointment Apply 1 application topically daily.    Historical Provider, MD  Multiple Vitamin (MULTIVITAMIN WITH MINERALS) TABS tablet Take 1 tablet by mouth daily.    Historical Provider, MD       Physical Exam: BP (!) 88/63   Pulse (!) 134   Temp 97.8 F (36.6 C) (Rectal)   Resp (!) 37   Ht 5\' 6"  (1.676 m)   Wt 49.9 kg (110 lb)   BMI 17.75 kg/m  General appearance: Frail elderly male, lethargic and unresponsive, rapid coarse breaths.   Eyes: Anicteric, lids and lashes dry. PERRL.    ENT: No nasal deformity, discharge, epistaxis.  Hearing not possible to assess. OP dry membranes.  Edentulous. Neck: No neck masses.  Trachea midline.   Lymph: No cervical or supraclavicular lymphadenopathy. Skin: Dry, xerotic.  No suspicious rashes or lesions.  Right arm edema.   Cardiac: Tachycardic, regular, nl S1-S2, no murmurs appreciated.  Radial and carotid pulses weak and thready, DP pulses diminished. Respiratory: Breathing rapid, and labored.  No wheezes, coarse sounds diffusely. Abdomen: Abdomen scaphoid, no masses appreicated. No ascites, distension, hepatosplenomegaly.   MSK: Extreme loss of muscle mass, diffusely.  Left side contractured. Neuro: Unresponsive. Psych: Unable to assess.     Labs on Admission:  I have personally reviewed following labs and imaging studies: CBC:  Recent Labs Lab 12/11/2015 1925 12/09/2015 1943  WBC 32.9*  --   NEUTROABS 28.6*  --   HGB 9.6* 10.2*  HCT 31.8* 30.0*  MCV 96.4  --   PLT 354  --    Basic Metabolic Panel:  Recent Labs Lab 12/03/2015 1925 12/09/2015 1943  NA 152* 153*  K 4.0 3.9  CL 115* 113*  CO2 28  --   GLUCOSE 134* 125*  BUN 42* 39*  CREATININE 1.41* 1.40*  CALCIUM 9.0  --    GFR: Estimated Creatinine Clearance: 28.7 mL/min (by C-G formula based on SCr  of 1.4 mg/dL (H)).  Liver Function Tests:  Recent Labs Lab 12/04/2015 1925  AST 21  ALT 12*  ALKPHOS 78  BILITOT 0.5  PROT 6.2*  ALBUMIN 1.9*   CBG:  Recent Labs Lab 12/26/2015 2001  GLUCAP 127*   Sepsis Labs: Lactic acid 3.6       Radiological Exams on Admission: Personally reviewed chest x-ray shows interstitial markings, no obvious pneumonia.  CT head report reviewed: Ct Head Wo Contrast  Result Date: 12/10/2015 CLINICAL DATA:  Altered mental status, found unresponsive at 1500 hours, history hypertension, stroke, prostate cancer EXAM: CT HEAD WITHOUT CONTRAST TECHNIQUE: Contiguous axial images were obtained from the  base of the skull through the vertex without intravenous contrast. COMPARISON:  09/24/2015 FINDINGS: Generalized atrophy. Stable ventricular morphology. No midline shift or mass effect. Encephalomalacia secondary to a large old RIGHT MCA territory infarct. Small vessel chronic ischemic changes of deep cerebral white matter. No intracranial hemorrhage, mass lesion, or evidence acute infarction. No extra-axial fluid collections. Atherosclerotic calcifications at the carotid siphons. Scattered motion artifacts. No definite osseous or sinus abnormalities. IMPRESSION: Atrophy with small vessel chronic ischemic changes of deep cerebral white matter. Old RIGHT MCA territory infarct. No acute intracranial abnormalities. Electronically Signed   By: Lavonia Dana M.D.   On: 12/17/2015 21:29   Dg Chest Portable 1 View  Result Date: 12/12/2015 CLINICAL DATA:  Altered mental status and fall. EXAM: PORTABLE CHEST 1 VIEW COMPARISON:  02/06/2015 FINDINGS: The patient is rotated and leaning to the left. The cardiomediastinal silhouette is unremarkable. Mild left basilar atelectasis noted. There is no evidence of focal airspace disease, pulmonary edema, suspicious pulmonary nodule/mass, pleural effusion, or pneumothorax. No acute bony abnormalities are identified. IMPRESSION: Mild left  basilar atelectasis without other significant abnormality. Electronically Signed   By: Margarette Canada M.D.   On: 12/18/2015 19:51    EKG: Independently reviewed. Rate 109, baseline wander extreme, dificult to interpret.  Appears sinus tachycardia without ST changes.    Assessment/Plan 1. Septic shock:  Suspected source unclear, favor infected ulcer vs UTI. Organism unknown. Patient meets criteria given tachycardia, tachypnea, leukocytosis, and evidence of multiorgan dysfunction and hypotension < 90 mmHg despite 30 cc/kg bolus.  Lactate 3.6 mmol/L and repeat ordered within 6 hours.  This patient is at high risk of poor outcomes with a SOFA score of 3.  Antibiotics delivered in the ED.    -Sepsis bundle utilized:  -Blood and urine cultures drawn  -30 ml/kg bolus given in ED, will repeat lactic acid and continue boluses  -Start targeted antibiotics with vancomycin and cefepime, based on suspected source of infection    -Repeat renal function and complete blood count in AM  -Code SEPSIS called to E-link  Discussed poor prognosis with sister at bedside.  Dr. Jeneen Rinks and I both confirmed the patient's DNR code status (accompanied by yellow MOST form) and she and her partner both were able to articulate well the patient's depression and frustration with his physical dependency.  Agreed for no escalation of care.  Will continue IV fluids and broad spectrum antibiotics, trend lactic acid to adjust fluids, and re-evaluate in 12-24 hours and consider comfort care. -Consult to Palliative Care       2. Hypernatremia:  Free water deficit 2.1L. -1/2 NS at 150 per hour in addition to fluid boluses -Repeat BMP q12 hrs  3. Partial seizures:  -Restart eslicarbazepine when able to take PO  4. Previous CVA:   5. Severe protein calorie malnutrition:  -Restart Ensure when able  6. Paroxysmal A. fib:  CHADS2-VASc 5.  On Xarelto and diltiazem and propranolol. -Will not bridge for now, if improving  clinically, can restart oral Xarelto -Hold BB and CCB until hemodynamics improve  7. Sacral wound:  -Consult to [place]  8. Other medications: -Hold oxycodone, clonazepam until able to take PO -Hold sertraline until able to take PO       DVT prophylaxis: Lovenox for now, restart Xarelto if able  Code Status: DO NOT RESUSCITATE  Family Communication: Sister at bedside.  Patient's estranged daughter with schizophrenia lives distantly.  CODE STATUS confirmed.  Disposition Plan: Anticipate broad spectrum antibiotics and IV fluids.  No escalation of care, no intubation, no central lines or pressors.  If worsening or not improving within 24 hours, will begin to withdraw cares, initiate comfort measures. Consults called: None Admission status: INPATIENT, stepdown       Medical decision making: Patient seen at 10:30 PM on 11/29/2015.  The patient was discussed with Dr. Jeneen Rinks.  What exists of the patient's chart was reviewed in depth and summarized above.  Clinical condition: poor prognosis and further deterioration overnight likely.  BP still 0000000 systolic despite fluid boluses.  Continue aggressive fluids and antibiotics.      Edwin Dada [facility] Pager [phone]

## 2015-12-14 DIAGNOSIS — J690 Pneumonitis due to inhalation of food and vomit: Secondary | ICD-10-CM

## 2015-12-14 DIAGNOSIS — L89154 Pressure ulcer of sacral region, stage 4: Secondary | ICD-10-CM

## 2015-12-14 DIAGNOSIS — A419 Sepsis, unspecified organism: Secondary | ICD-10-CM | POA: Insufficient documentation

## 2015-12-14 DIAGNOSIS — G9340 Encephalopathy, unspecified: Secondary | ICD-10-CM

## 2015-12-14 LAB — BLOOD CULTURE ID PANEL (REFLEXED)
Acinetobacter baumannii: NOT DETECTED
CANDIDA ALBICANS: NOT DETECTED
Candida glabrata: NOT DETECTED
Candida krusei: NOT DETECTED
Candida parapsilosis: NOT DETECTED
Candida tropicalis: NOT DETECTED
ENTEROBACTERIACEAE SPECIES: NOT DETECTED
ENTEROCOCCUS SPECIES: NOT DETECTED
Enterobacter cloacae complex: NOT DETECTED
Escherichia coli: NOT DETECTED
HAEMOPHILUS INFLUENZAE: NOT DETECTED
KLEBSIELLA PNEUMONIAE: NOT DETECTED
Klebsiella oxytoca: NOT DETECTED
LISTERIA MONOCYTOGENES: NOT DETECTED
NEISSERIA MENINGITIDIS: NOT DETECTED
Proteus species: NOT DETECTED
Pseudomonas aeruginosa: NOT DETECTED
STAPHYLOCOCCUS AUREUS BCID: NOT DETECTED
STREPTOCOCCUS PNEUMONIAE: NOT DETECTED
STREPTOCOCCUS PYOGENES: NOT DETECTED
STREPTOCOCCUS SPECIES: NOT DETECTED
Serratia marcescens: NOT DETECTED
Staphylococcus species: NOT DETECTED
Streptococcus agalactiae: NOT DETECTED

## 2015-12-14 LAB — CBC
HCT: 35.3 % — ABNORMAL LOW (ref 39.0–52.0)
Hemoglobin: 10.4 g/dL — ABNORMAL LOW (ref 13.0–17.0)
MCH: 29.1 pg (ref 26.0–34.0)
MCHC: 29.5 g/dL — AB (ref 30.0–36.0)
MCV: 98.6 fL (ref 78.0–100.0)
Platelets: 276 10*3/uL (ref 150–400)
RBC: 3.58 MIL/uL — ABNORMAL LOW (ref 4.22–5.81)
RDW: 16.4 % — AB (ref 11.5–15.5)
WBC: 29.7 10*3/uL — ABNORMAL HIGH (ref 4.0–10.5)

## 2015-12-14 LAB — BASIC METABOLIC PANEL
Anion gap: 9 (ref 5–15)
BUN: 41 mg/dL — ABNORMAL HIGH (ref 6–20)
CALCIUM: 7.9 mg/dL — AB (ref 8.9–10.3)
CO2: 24 mmol/L (ref 22–32)
CREATININE: 1.28 mg/dL — AB (ref 0.61–1.24)
Chloride: 119 mmol/L — ABNORMAL HIGH (ref 101–111)
GFR calc non Af Amer: 50 mL/min — ABNORMAL LOW (ref >60)
GFR, EST AFRICAN AMERICAN: 58 mL/min — AB (ref >60)
Glucose, Bld: 148 mg/dL — ABNORMAL HIGH (ref 65–99)
Potassium: 4 mmol/L (ref 3.5–5.1)
SODIUM: 152 mmol/L — AB (ref 135–145)

## 2015-12-14 LAB — LACTIC ACID, PLASMA
LACTIC ACID, VENOUS: 3.1 mmol/L — AB (ref 0.5–1.9)
LACTIC ACID, VENOUS: 2.5 mmol/L — AB (ref 0.5–1.9)

## 2015-12-14 MED ORDER — MORPHINE SULFATE (PF) 2 MG/ML IV SOLN: 1.0000 mg | INTRAVENOUS | Status: DC | PRN

## 2015-12-14 MED ORDER — PIPERACILLIN-TAZOBACTAM 3.375 G IVPB 30 MIN: 3.3750 g | Freq: Three times a day (TID) | INTRAVENOUS | Status: DC

## 2015-12-14 MED ORDER — SENNOSIDES-DOCUSATE SODIUM 8.6-50 MG PO TABS
1.0000 | ORAL_TABLET | Freq: Every evening | ORAL | Status: DC | PRN
  Filled 2015-12-14: qty 1

## 2015-12-14 MED ORDER — LORAZEPAM 1 MG PO TABS: 1.0000 mg | ORAL_TABLET | ORAL | Status: DC | PRN

## 2015-12-14 MED ORDER — PIPERACILLIN-TAZOBACTAM 3.375 G IVPB
3.3750 g | Freq: Three times a day (TID) | INTRAVENOUS | Status: DC
  Administered 2015-12-14: 3.375 g via INTRAVENOUS
  Filled 2015-12-14: qty 50

## 2015-12-14 MED ORDER — LORAZEPAM 2 MG/ML IJ SOLN: 1.0000 mg | INTRAMUSCULAR | Status: DC | PRN

## 2015-12-14 MED ORDER — SODIUM CHLORIDE 0.9% FLUSH: 3.0000 mL | Freq: Two times a day (BID) | INTRAVENOUS | Status: DC

## 2015-12-14 MED ORDER — SODIUM CHLORIDE 0.9 % IV SOLN
500.0000 mg | Freq: Two times a day (BID) | INTRAVENOUS | Status: DC
  Administered 2015-12-14: 500 mg via INTRAVENOUS
  Filled 2015-12-14 (×2): qty 5

## 2015-12-14 MED ORDER — ENOXAPARIN SODIUM 40 MG/0.4ML ~~LOC~~ SOLN
40.0000 mg | Freq: Every day | SUBCUTANEOUS | Status: DC
  Filled 2015-12-14: qty 0.4

## 2015-12-14 MED ORDER — ONDANSETRON HCL 4 MG PO TABS: 4.0000 mg | ORAL_TABLET | Freq: Four times a day (QID) | ORAL | Status: DC | PRN

## 2015-12-14 MED ORDER — ACETAMINOPHEN 650 MG RE SUPP: 650.0000 mg | Freq: Four times a day (QID) | RECTAL | Status: DC | PRN

## 2015-12-14 MED ORDER — ONDANSETRON HCL 4 MG/2ML IJ SOLN: 4.0000 mg | Freq: Four times a day (QID) | INTRAMUSCULAR | Status: DC | PRN

## 2015-12-14 MED ORDER — DEXTROSE 5 % IV SOLN: 1.0000 g | INTRAVENOUS | Status: DC

## 2015-12-14 MED ORDER — SODIUM CHLORIDE 0.45 % IV SOLN
INTRAVENOUS | Status: DC
  Administered 2015-12-14 (×2): via INTRAVENOUS

## 2015-12-14 MED ORDER — LORAZEPAM 2 MG/ML PO CONC: 1.0000 mg | ORAL | Status: DC | PRN

## 2015-12-14 MED ORDER — ACETAMINOPHEN 325 MG PO TABS: 650.0000 mg | ORAL_TABLET | Freq: Four times a day (QID) | ORAL | Status: DC | PRN

## 2015-12-15 LAB — CULTURE, BLOOD (ROUTINE X 2)

## 2015-12-15 LAB — URINE CULTURE

## 2015-12-17 LAB — CULTURE, BLOOD (ROUTINE X 2)

## 2015-12-28 NOTE — ED Notes (Signed)
[facility]. Pt ineligible for organ donation d/t age. Referral code [phone], spoke with Alvira Philips. Release to funeral home when ready.

## 2015-12-28 NOTE — ED Notes (Signed)
Notified Dr. Loleta Books of Lactic Acid 3.1

## 2015-12-28 NOTE — ED Provider Notes (Signed)
[facility] DEPT Provider Note   CSN: [id number] Arrival date & time: 12/22/2015  1914    History   Chief Complaint Chief Complaint  Patient presents with  . unresponsive    HPI Zachary Leonard is a 82 y.o. male.  The history is provided by the nursing home, the EMS personnel and a relative. The history is limited by the condition of the patient.  Altered Mental Status   This is a new problem. The current episode started 3 to 5 hours ago. The problem has not changed since onset.Associated symptoms include confusion, somnolence and unresponsiveness. Associated symptoms comments: Facility reported one episode of vomiting earlier . His past medical history is significant for CVA. His past medical history does not include head trauma (no falls reported, bed bound).    Past Medical History:  Diagnosis Date  . Cancer [facility])    prostate, treated with XRT  . CVA (cerebral infarction) 11/27/2014  . HTN (hypertension)   . Stroke [facility])     Patient Active Problem List   Diagnosis Date Noted  . Severe protein-calorie malnutrition ([place]) 12/22/2015  . Decubital ulcer 12/12/2015  . Hypernatremia 12/24/2015  . Partial seizures ([place]) 12/13/2015  . Paroxysmal atrial fibrillation (H[facility])   . Palliative care encounter 02/07/2015  . Dysphagia   . Septic shock ([place]) 02/05/2015  . Personal history of prostate cancer 02/05/2015  . History of CVA (cerebrovascular accident)   . Benign essential HTN 11/27/2014    Past Surgical History:  Procedure Laterality Date  . NO PAST SURGERIES         Home Medications    Prior to Admission medications   Medication Sig Start Date End Date Taking? Authorizing Provider  Amino Acids-Protein Hydrolys (PRO-STAT AWC) LIQD Take 30 mLs by mouth 2 (two) times daily. ([place])   Yes Historical Provider, MD  clonazePAM (KLONOPIN) 0.5 MG tablet Take 0.5 mg by mouth every 12 (twelve) hours as needed for anxiety.   Yes Historical Provider, MD  diltiazem (CARDIZEM) 30 MG  tablet Take 1 tablet (30 mg total) by mouth every 6 (six) hours. Patient taking differently: Take 30 mg by mouth 4 (four) times daily.  02/13/15  Yes Zada Finders, MD  Eslicarbazepine Acetate (APTIOM) 400 MG TABS Take 1 tablet by mouth daily.   Yes Historical Provider, MD  LORazepam (ATIVAN) 0.5 MG tablet Take 0.5 mg by mouth every 8 (eight) hours as needed (for shaking).    Yes Historical Provider, MD  oxyCODONE-acetaminophen (PERCOCET) 5-325 MG tablet Take 1 tablet by mouth every 8 (eight) hours as needed (for pain).   Yes Historical Provider, MD  propranolol (INDERAL) 20 MG tablet Take 20 mg by mouth 2 (two) times daily.   Yes Historical Provider, MD  rivaroxaban (XARELTO) 20 MG TABS tablet Take 20 mg by mouth daily with supper.   Yes Historical Provider, MD  sertraline (ZOLOFT) 25 MG tablet Take 25 mg by mouth every morning.   Yes Historical Provider, MD  UNABLE TO FIND Med Plus 2.0: Drink 240 ml's by mouth three times a day for nutritional support   Yes Historical Provider, MD  collagenase (SANTYL) ointment Apply 1 application topically daily.    Historical Provider, MD  Multiple Vitamin (MULTIVITAMIN WITH MINERALS) TABS tablet Take 1 tablet by mouth daily.    Historical Provider, MD    Family History Family History  Problem Relation Age of Onset  . Hypertension      Social History Social History  Substance Use Topics  .  Smoking status: Never Smoker  . Smokeless tobacco: Never Used  . Alcohol use No     Comment: quit 10-12 years ago, prior drank 1 glass of wine daily     Allergies   Review of patient's allergies indicates no known allergies.   Review of Systems Review of Systems  Unable to perform ROS: Mental status change (hx obtained from SNF and family)  Constitutional: Negative for fever.  Respiratory: Negative for cough.   Gastrointestinal: Positive for vomiting. Negative for abdominal pain.  Genitourinary: Negative for decreased urine volume.  Skin: Positive for  wound (numerous chronic wounds to sacral area, feet).  Allergic/Immunologic: Negative for immunocompromised state.  Hematological: Bruises/bleeds easily (xarelto ).  Psychiatric/Behavioral: Positive for confusion.    Physical Exam Updated Vital Signs BP 114/69   Pulse (!) 134   Temp 97.8 F (36.6 C) (Rectal)   Resp (!) 28   Ht 5\' 6"  (1.676 m)   Wt 49.9 kg   BMI 17.75 kg/m   Physical Exam  Constitutional: He appears listless.  Thin, cachectic, minimally responsive   HENT:  Head: Normocephalic and atraumatic.  Eyes: Conjunctivae are normal. Pupils are equal, round, and reactive to light.  Neck: Neck supple.  Cardiovascular: Normal rate and regular rhythm.   Pulmonary/Chest: Effort normal and breath sounds normal. No respiratory distress.  Abdominal: Soft. There is no tenderness.  Musculoskeletal: He exhibits no edema.  Large left buttock wounds with tunneling, right elbow wound and right foot wounds  Neurological: He appears listless. GCS eye subscore is 4. GCS verbal subscore is 1. GCS motor subscore is 3.  Skin: Skin is warm and dry.  Psychiatric: He has a normal mood and affect.  Nursing note and vitals reviewed.   ED Treatments / Results  Labs (all labs ordered are listed, but only abnormal results are displayed) Labs Reviewed  CBC WITH DIFFERENTIAL/PLATELET - Abnormal; Notable for the following:       Result Value   WBC 32.9 (*)    RBC 3.30 (*)    Hemoglobin 9.6 (*)    HCT 31.8 (*)    RDW 16.3 (*)    Neutro Abs 28.6 (*)    Monocytes Absolute 2.0 (*)    All other components within normal limits  COMPREHENSIVE METABOLIC PANEL - Abnormal; Notable for the following:    Sodium 152 (*)    Chloride 115 (*)    Glucose, Bld 134 (*)    BUN 42 (*)    Creatinine, Ser 1.41 (*)    Total Protein 6.2 (*)    Albumin 1.9 (*)    ALT 12 (*)    GFR calc non Af Amer 45 (*)    GFR calc Af Amer 52 (*)    All other components within normal limits  URINALYSIS, ROUTINE W REFLEX  MICROSCOPIC (NOT AT [facility]) - Abnormal; Notable for the following:    APPearance CLOUDY (*)    Bilirubin Urine SMALL (*)    Protein, ur 30 (*)    Leukocytes, UA SMALL (*)    All other components within normal limits  URINE MICROSCOPIC-ADD ON - Abnormal; Notable for the following:    Squamous Epithelial / LPF 0-5 (*)    Bacteria, UA MANY (*)    Casts GRANULAR CAST (*)    All other components within normal limits  CBG MONITORING, ED - Abnormal; Notable for the following:    Glucose-Capillary 127 (*)    All other components within normal limits  I-STAT CG4 LACTIC  ACID, ED - Abnormal; Notable for the following:    Lactic Acid, Venous 3.60 (*)    All other components within normal limits  I-STAT ARTERIAL BLOOD GAS, ED - Abnormal; Notable for the following:    pO2, Arterial 56.0 (*)    Acid-Base Excess 3.0 (*)    All other components within normal limits  I-STAT CHEM 8, ED - Abnormal; Notable for the following:    Sodium 153 (*)    Chloride 113 (*)    BUN 39 (*)    Creatinine, Ser 1.40 (*)    Glucose, Bld 125 (*)    Calcium, Ion 1.14 (*)    Hemoglobin 10.2 (*)    HCT 30.0 (*)    All other components within normal limits  CULTURE, BLOOD (ROUTINE X 2)  CULTURE, BLOOD (ROUTINE X 2)  URINE CULTURE  LACTIC ACID, PLASMA  LACTIC ACID, PLASMA  CBC  BASIC METABOLIC PANEL  BASIC METABOLIC PANEL  I-STAT TROPOININ, ED    EKG  EKG Interpretation None       Radiology Ct Head Wo Contrast  Result Date: 12/22/2015 CLINICAL DATA:  Altered mental status, found unresponsive at 1500 hours, history hypertension, stroke, prostate cancer EXAM: CT HEAD WITHOUT CONTRAST TECHNIQUE: Contiguous axial images were obtained from the base of the skull through the vertex without intravenous contrast. COMPARISON:  09/24/2015 FINDINGS: Generalized atrophy. Stable ventricular morphology. No midline shift or mass effect. Encephalomalacia secondary to a large old RIGHT MCA territory infarct. Small vessel  chronic ischemic changes of deep cerebral white matter. No intracranial hemorrhage, mass lesion, or evidence acute infarction. No extra-axial fluid collections. Atherosclerotic calcifications at the carotid siphons. Scattered motion artifacts. No definite osseous or sinus abnormalities. IMPRESSION: Atrophy with small vessel chronic ischemic changes of deep cerebral white matter. Old RIGHT MCA territory infarct. No acute intracranial abnormalities. Electronically Signed   By: Lavonia Dana M.D.   On: 12/19/2015 21:29   Dg Chest Portable 1 View  Result Date: 12/22/2015 CLINICAL DATA:  Altered mental status and fall. EXAM: PORTABLE CHEST 1 VIEW COMPARISON:  02/06/2015 FINDINGS: The patient is rotated and leaning to the left. The cardiomediastinal silhouette is unremarkable. Mild left basilar atelectasis noted. There is no evidence of focal airspace disease, pulmonary edema, suspicious pulmonary nodule/mass, pleural effusion, or pneumothorax. No acute bony abnormalities are identified. IMPRESSION: Mild left basilar atelectasis without other significant abnormality. Electronically Signed   By: Margarette Canada M.D.   On: 12/18/2015 19:51    Procedures Procedures (including critical care time)  Medications Ordered in ED Medications  vancomycin (VANCOCIN) IVPB 750 mg/150 ml premix (750 mg Intravenous Given 12/08/2015 2330)  LORazepam (ATIVAN) tablet 1 mg (not administered)    Or  LORazepam (ATIVAN) 2 MG/ML concentrated solution 1 mg (not administered)    Or  LORazepam (ATIVAN) injection 1 mg (not administered)  morphine 2 MG/ML injection 1 mg (not administered)  ondansetron (ZOFRAN) tablet 4 mg (not administered)    Or  ondansetron (ZOFRAN) injection 4 mg (not administered)  senna-docusate (Senokot-S) tablet 1 tablet (not administered)  acetaminophen (TYLENOL) tablet 650 mg (not administered)    Or  acetaminophen (TYLENOL) suppository 650 mg (not administered)  0.45 % sodium chloride infusion (not  administered)  sodium chloride flush (NS) 0.9 % injection 3 mL (not administered)  enoxaparin (LOVENOX) injection 40 mg (not administered)  ceFEPIme (MAXIPIME) 1 g in dextrose 5 % 50 mL IVPB (not administered)  sodium chloride 0.9 % bolus 1,500 mL (0 mLs Intravenous Stopped 12/18/2015  2153)  ceFEPIme (MAXIPIME) 2 g in dextrose 5 % 50 mL IVPB (0 g Intravenous Stopped 12/18/2015 2233)  sodium chloride 0.9 % bolus 2,000 mL (0 mLs Intravenous Stopped December 29, 2015 0120)     Initial Impression / Assessment and Plan / ED Course  I have reviewed the triage vital signs and the nursing notes.  Pertinent labs & imaging results that were available during my care of the patient were reviewed by me and considered in my medical decision making (see chart for details).  Clinical Course    82 year old male with history of prostate cancer, CVA with residual left-sided deficits, paroxysmal A. fib and DVTs on xarelto, hypertension presenting with altered mental status as above. Afebrile, hypotensive upon arrival. History limited by patient's poor mental status.   Head CT without any acute findings. He has a new acute leukocytosis at 32 and a lactic acidosis, concerning for possible sepsis. Source likely either from urine or from numerous skin wounds. Resuscitated with fluids with improvement in blood pressure. Cultures drawn and patient started on broad-spectrum antibiotics. He is also hypernatremic. His renal function is near baseline.   Discussed with patient's family who state he is a DNR/DNI, would not want central line or pressors. Will continue septic treatment with antibiotics and fluids for now. Admitted for further care in stabilized condition.   Case discussed with Dr. Jeneen Rinks who oversaw management of this patient.   Final Clinical Impressions(s) / ED Diagnoses   Final diagnoses:  Sepsis, due to unspecified organism [facility])  Somnolence    New Prescriptions New Prescriptions   No medications on file       Ivin Booty, MD 12/29/2015 0126    Tanna Furry, MD 12/16/15 [phone]

## 2015-12-28 NOTE — Progress Notes (Signed)
Paged by RN to inform of oxygen desaturation and subsequent asystole. Arrived in ED within 2-3 min Upon arrival pt unresponsive to protopathic stimuli No heart tones auscultated No carotid pulses palpated No spontaneous respirations Pupils fixed  Pt pronounced at 1030.  Pt's sister informed by me personally.  DTat

## 2015-12-28 NOTE — ED Notes (Signed)
Dr. Carles Collet called TOD at 1030.

## 2015-12-28 NOTE — Progress Notes (Signed)
PROGRESS NOTE  Zachary Leonard [id number] DOB: 10-31-1931 DOA: 12/17/2015 PCP: PROVIDER NOT IN SYSTEM  Brief History:  82 year old male with a history of atrial fibrillation, stroke with left hemiparesis and dysarthria, hypertension was noted to be unresponsive at his nursing facility on the afternoon of 12/19/2015. History is obtained from the patient's sister S patient is encephalopathic at this time. According to the patient's sister, the patient has underwent functional decline since his stroke in November 2016.  He is essentially wheelchair bound and has had difficulty with dysphagia requiring thickened liquids. In the past 2 weeks, the patient has had decreased oral intake and has appeared more apathetic. Upon presentation, the patient was lethargic and tachycardic. Workup revealed him to be WBC 32.9 with lactic acid 3.6, sodium 152, serum creatinine 1.41. The patient was started on vancomycin and cefepime as well as intravenous fluids. Unfortunately, the patient is estranged from his family and has no power of attorney. A MOST accompanied the patient which verified his DO NOT RESUSCITATE status.  Assessment/Plan: Sepsis -secondary to aspiration pneumonia and infected sacral/ischial decubitus -continue vancomycin -d/c cefepime -start zosyn -trend lactate -continue IVF -CT brain negative for acute findings  Acute metabolic encephalopathy -secondary to sepsis -treat underlying conditions  Stage 4 sacral decubitus -consult wound care -present at the time of admission -necrotic tissue present  CKD 3 -baseline creatinine 1.-1.4  Hypernatremia/FTT -continue hypotonic saline -repeat BMP today  Afib -CHADS-VASc = 5 -hold xarelto for now as pt is encephalopathic, unable to swallow  History stroke Nov 2016 -residual L-hemiparesis  Hx of seizures -keppra IV for now until pt is alert enough to take po  HTN history -hold inderal due to hypotension  Goals of  Care -Discussed with the patient's sister -Continue present care--DNR -If no improvement in the next 24 hours, transition patient to comfort care measures    Disposition Plan:   Admit to stepdown, high risk of mortality Family Communication:   Updated pt's sister on phone 12/16/15  Consultants:  none  Code Status:   DNR  DVT Prophylaxis:  [place] Lovenox   Procedures: As Listed in Progress Note Above  Antibiotics: None    Subjective: Unobtainable secondary to patient's mental status  Objective: Vitals:   Dec 16, 2015 0545 2015-12-16 0630 12/16/2015 0645 Dec 16, 2015 0732  BP: 106/83 (!) 89/54 97/68 99/57   Pulse:    (!) 54  Resp: 24 26 (!) 27 14  Temp:      TempSrc:      SpO2:    100%  Weight:      Height:        Intake/Output Summary (Last 24 hours) at 12-16-2015 0807 Last data filed at 12/16/15 0120  Gross per 24 hour  Intake             3550 ml  Output                0 ml  Net             3550 ml   Weight change:  Exam:   General:  Pt is alert, follows commands appropriately, not in acute distress  HEENT: No icterus, No thrush, No neck mass, [place]/AT  Cardiovascular: RRR, S1/S2, no rubs, no gallops  Respiratory: Bilateral scattered rhonchi.  Abdomen: Soft/+BS, non tender, non distended, no guarding  Extremities: 1 + LE edema, No lymphangitis, No petechiae, No rashes, no synovitis;sacral wound for sacral wound with necrotic tissue, bone palpable  Data Reviewed: I have personally reviewed following labs and imaging studies Basic Metabolic Panel:  Recent Labs Lab 12/25/2015 1925 12/02/2015 1943 2015/12/30 0205  NA 152* 153* 152*  K 4.0 3.9 4.0  CL 115* 113* 119*  CO2 28  --  24  GLUCOSE 134* 125* 148*  BUN 42* 39* 41*  CREATININE 1.41* 1.40* 1.28*  CALCIUM 9.0  --  7.9*   Liver Function Tests:  Recent Labs Lab 12/04/2015 1925  AST 21  ALT 12*  ALKPHOS 78  BILITOT 0.5  PROT 6.2*  ALBUMIN 1.9*   No results for input(s): LIPASE, AMYLASE in the last 168  hours. No results for input(s): AMMONIA in the last 168 hours. Coagulation Profile: No results for input(s): INR, PROTIME in the last 168 hours. CBC:  Recent Labs Lab 12/25/2015 1925 11/29/2015 1943 12/30/15 0205  WBC 32.9*  --  29.7*  NEUTROABS 28.6*  --   --   HGB 9.6* 10.2* 10.4*  HCT 31.8* 30.0* 35.3*  MCV 96.4  --  98.6  PLT 354  --  276   Cardiac Enzymes: No results for input(s): CKTOTAL, CKMB, CKMBINDEX, TROPONINI in the last 168 hours. BNP: Invalid input(s): POCBNP CBG:  Recent Labs Lab 12/03/2015 2001  GLUCAP 127*   HbA1C: No results for input(s): HGBA1C in the last 72 hours. Urine analysis:    Component Value Date/Time   COLORURINE YELLOW 12/27/2015 1925   APPEARANCEUR CLOUDY (A) 12/12/2015 1925   LABSPEC 1.021 12/21/2015 1925   PHURINE 5.5 12/23/2015 1925   GLUCOSEU NEGATIVE 12/21/2015 1925   HGBUR NEGATIVE 12/05/2015 1925   BILIRUBINUR SMALL (A) 12/20/2015 1925   KETONESUR NEGATIVE 12/27/2015 1925   PROTEINUR 30 (A) 12/25/2015 1925   UROBILINOGEN 1.0 02/05/2015 1455   NITRITE NEGATIVE 12/26/2015 1925   LEUKOCYTESUR SMALL (A) 12/16/2015 1925   Sepsis Labs: @LABRCNTIP (procalcitonin:4,lacticidven:4) )No results found for this or any previous visit (from the past 240 hour(s)).   Scheduled Meds: . enoxaparin (LOVENOX) injection  40 mg Subcutaneous Daily  . sodium chloride flush  3 mL Intravenous Q12H  . vancomycin  750 mg Intravenous Q24H   Continuous Infusions: . sodium chloride 150 mL/hr at December 30, 2015 0150  . levETIRAcetam    . piperacillin-tazobactam      Procedures/Studies: Ct Head Wo Contrast  Result Date: 12/05/2015 CLINICAL DATA:  Altered mental status, found unresponsive at 1500 hours, history hypertension, stroke, prostate cancer EXAM: CT HEAD WITHOUT CONTRAST TECHNIQUE: Contiguous axial images were obtained from the base of the skull through the vertex without intravenous contrast. COMPARISON:  09/24/2015 FINDINGS: Generalized atrophy. Stable  ventricular morphology. No midline shift or mass effect. Encephalomalacia secondary to a large old RIGHT MCA territory infarct. Small vessel chronic ischemic changes of deep cerebral white matter. No intracranial hemorrhage, mass lesion, or evidence acute infarction. No extra-axial fluid collections. Atherosclerotic calcifications at the carotid siphons. Scattered motion artifacts. No definite osseous or sinus abnormalities. IMPRESSION: Atrophy with small vessel chronic ischemic changes of deep cerebral white matter. Old RIGHT MCA territory infarct. No acute intracranial abnormalities. Electronically Signed   By: Lavonia Dana M.D.   On: 12/01/2015 21:29   Dg Chest Portable 1 View  Result Date: 12/26/2015 CLINICAL DATA:  Altered mental status and fall. EXAM: PORTABLE CHEST 1 VIEW COMPARISON:  02/06/2015 FINDINGS: The patient is rotated and leaning to the left. The cardiomediastinal silhouette is unremarkable. Mild left basilar atelectasis noted. There is no evidence of focal airspace disease, pulmonary edema, suspicious pulmonary nodule/mass, pleural effusion, or pneumothorax.  No acute bony abnormalities are identified. IMPRESSION: Mild left basilar atelectasis without other significant abnormality. Electronically Signed   By: Margarette Canada M.D.   On: 12/27/2015 19:51    Susannah Carbin, DO  [facility] Pager [phone]  If 7PM-7AM, please contact night-coverage [email or web address] Password [email or web address] 20-Dec-2015, 8:07 AM   LOS: 1 day

## 2015-12-28 NOTE — ED Notes (Signed)
Wound dressings on bottom changed, with incontinence change.

## 2015-12-28 NOTE — ED Notes (Signed)
Dr. Carles Collet requesting this RN to place pt on 6L [place]. SpO2 93-96% on 6L [place] until 0800. SpO2 now 88-89% on 6L [place]. Will page and inform admitting team.

## 2015-12-28 NOTE — ED Notes (Signed)
1025 - this RN entered room to transport pt to floor, pt no longer on NRB as RN had left pt at 1005. Pt was on 3L [place], this RN had not been informed of change from NRB to [place]. SpO2 alarm now alarming at 50%. Pt placed back on NRB, SpO2 not improving and hr dropping. Dr. Carles Collet paged and en route to ED. Pt went into asystole, no palpable pulses, remaining unresponsive. ER physician at bedside to evaluate pt and agrees asystole and no palpable pulses at 1030. Dr. Carles Collet called TOD at 1030.

## 2015-12-28 NOTE — Progress Notes (Addendum)
I reevaluated the patient at 69 -Patient remains unresponsive to report neuropathic stimuli -No respiratory distress VSS  HR 52-60, RR 25--90/39--98-99% on 4L [place] irreg Lung-bilateral rhonchi  I placed pt on 4 L [place]--sats remained 98-99% x 5-10 min -awaiting zosyn dosing. -sister updated -prognosis poor  DTat

## 2015-12-28 NOTE — ED Notes (Signed)
Admitting MD at bedside.

## 2015-12-28 NOTE — Discharge Summary (Signed)
Death Summary  Zachary Leonard [id number] DOB: 1931/05/15 DOA: 12/24/2015  PCP: PROVIDER NOT IN SYSTEM  Admit date: 24-Dec-2015 Date of Death: 12-25-15 Time of Death: 07/08/28 Notification: not done as pt did not have PCP  History of present illness:  82 year old male with a history of atrial fibrillation, stroke with left hemiparesis and dysarthria, hypertension was noted to be unresponsive at his nursing facility on the afternoon of 2015-12-24. History is obtained from the patient's sister as patient was encephalopathic and had mild increased work of breathing. According to the patient's sister, the patient has underwent functional decline since his stroke in November 2016.  He is essentially wheelchair bound and has had difficulty with dysphagia requiring thickened liquids. In the past 2 weeks, the patient has had decreased oral intake and has appeared more apathetic. Upon presentation, the patient was lethargic and tachycardic. Workup revealed him to be WBC 32.9 with lactic acid 3.6, sodium 152, serum creatinine 1.41. The patient was started on vancomycin and cefepime as well as intravenous fluids. Unfortunately, the patient is estranged from his family and has no power of attorney. A MOST accompanied the patient which verified his DO NOT RESUSCITATE status.  The patient was treated for sepsis secondary to aspiration pneumonia and infected sacral decubitus ulcer. He was continued on vancomycin and switched to Zosyn. Given the patient's multiple comorbidities, his overall prognosis was poor. The patient's sister was contacted and multiple discussions were held with her. She agreed with keeping the patient DO NOT RESUSCITATE and did not want to escalate care. She did not want the patient to be transferred to the ICU or perform any aggressive procedures. She was agreeable to continue IV fluids and intravenous antibiotics with the understanding that if the patient did not show significant improvement in the  next 24 hours after admission, then she wanted a transition to full comfort care. Unfortunately, the patient continued to have a fairly rapid decline in his clinical status, ultimately developing agonal respirations.  Ultimately, the patient expired without any distress on 12-25-15 @ 1030   Final Diagnoses:  Sepsis -secondary to aspiration pneumonia and infected sacral/ischial decubitus -continue vancomycin -d/c cefepime -start zosyn -trend lactate -continue IVF -CT brain negative for acute findings  Acute metabolic encephalopathy -secondary to sepsis -treat underlying conditions  Stage 4 sacral decubitus--infected -bone palpable on exam consult wound care -present at the time of admission -necrotic tissue present  CKD 3 -baseline creatinine 1.-1.4  Hypernatremia/FTT -continue hypotonic saline -repeat BMP  Afib -CHADS-VASc = 5 -hold xarelto for now as pt is encephalopathic, unable to swallow  History stroke Nov 2016 -residual L-hemiparesis  Hx of seizures -keppra IV for now until pt is alert enough to take po  HTN history -hold inderal due to hypotension  Goals of Care -Discussed with the patient's sister -Continue present care--DNR, no escalation of care -If no improvement in the next 24 hours, transition patient to comfort care measures    The results of significant diagnostics from this hospitalization (including imaging, microbiology, ancillary and laboratory) are listed below for reference.    Significant Diagnostic Studies: Ct Head Wo Contrast  Result Date: December 24, 2015 CLINICAL DATA:  Altered mental status, found unresponsive at 1500 hours, history hypertension, stroke, prostate cancer EXAM: CT HEAD WITHOUT CONTRAST TECHNIQUE: Contiguous axial images were obtained from the base of the skull through the vertex without intravenous contrast. COMPARISON:  09/24/2015 FINDINGS: Generalized atrophy. Stable ventricular morphology. No midline shift or  mass effect. Encephalomalacia secondary to a large old RIGHT  MCA territory infarct. Small vessel chronic ischemic changes of deep cerebral white matter. No intracranial hemorrhage, mass lesion, or evidence acute infarction. No extra-axial fluid collections. Atherosclerotic calcifications at the carotid siphons. Scattered motion artifacts. No definite osseous or sinus abnormalities. IMPRESSION: Atrophy with small vessel chronic ischemic changes of deep cerebral white matter. Old RIGHT MCA territory infarct. No acute intracranial abnormalities. Electronically Signed   By: Lavonia Dana M.D.   On: 12/06/2015 21:29   Dg Chest Portable 1 View  Result Date: 12/07/2015 CLINICAL DATA:  Altered mental status and fall. EXAM: PORTABLE CHEST 1 VIEW COMPARISON:  02/06/2015 FINDINGS: The patient is rotated and leaning to the left. The cardiomediastinal silhouette is unremarkable. Mild left basilar atelectasis noted. There is no evidence of focal airspace disease, pulmonary edema, suspicious pulmonary nodule/mass, pleural effusion, or pneumothorax. No acute bony abnormalities are identified. IMPRESSION: Mild left basilar atelectasis without other significant abnormality. Electronically Signed   By: Margarette Canada M.D.   On: 12/01/2015 19:51    Microbiology: Recent Results (from the past 240 hour(s))  Blood culture (routine x 2)     Status: None (Preliminary result)   Collection Time: 12/11/2015  7:20 PM  Result Value Ref Range Status   Specimen Description BLOOD RIGHT ANTECUBITAL  Final   Special Requests BOTTLES DRAWN AEROBIC AND ANAEROBIC 5CC  Final   Culture NO GROWTH < 24 HOURS  Final   Report Status PENDING  Incomplete  Urine culture     Status: None (Preliminary result)   Collection Time: 11/29/2015  7:30 PM  Result Value Ref Range Status   Specimen Description URINE, RANDOM  Final   Special Requests NONE  Final   Culture CULTURE REINCUBATED FOR BETTER GROWTH  Final   Report Status PENDING  Incomplete  Blood  culture (routine x 2)     Status: None (Preliminary result)   Collection Time: 12/20/2015  9:42 PM  Result Value Ref Range Status   Specimen Description BLOOD RIGHT ARM  Final   Special Requests IN PEDIATRIC BOTTLE 3CC  Final   Culture NO GROWTH < 24 HOURS  Final   Report Status PENDING  Incomplete     Labs: Basic Metabolic Panel:  Recent Labs Lab 12/08/2015 1925 12/16/2015 1943 2016/01/04 0205  NA 152* 153* 152*  K 4.0 3.9 4.0  CL 115* 113* 119*  CO2 28  --  24  GLUCOSE 134* 125* 148*  BUN 42* 39* 41*  CREATININE 1.41* 1.40* 1.28*  CALCIUM 9.0  --  7.9*   Liver Function Tests:  Recent Labs Lab 12/12/2015 1925  AST 21  ALT 12*  ALKPHOS 78  BILITOT 0.5  PROT 6.2*  ALBUMIN 1.9*   No results for input(s): LIPASE, AMYLASE in the last 168 hours. No results for input(s): AMMONIA in the last 168 hours. CBC:  Recent Labs Lab 12/24/2015 1925 12/16/2015 1943 01-04-2016 0205  WBC 32.9*  --  29.7*  NEUTROABS 28.6*  --   --   HGB 9.6* 10.2* 10.4*  HCT 31.8* 30.0* 35.3*  MCV 96.4  --  98.6  PLT 354  --  276   Cardiac Enzymes: No results for input(s): CKTOTAL, CKMB, CKMBINDEX, TROPONINI in the last 168 hours. D-Dimer No results for input(s): DDIMER in the last 72 hours. BNP: Invalid input(s): POCBNP CBG:  Recent Labs Lab 12/12/2015 2001  GLUCAP 127*   Anemia work up No results for input(s): VITAMINB12, FOLATE, FERRITIN, TIBC, IRON, RETICCTPCT in the last 72 hours. Urinalysis  Component Value Date/Time   COLORURINE YELLOW 12/04/2015 1925   APPEARANCEUR CLOUDY (A) 11/29/2015 1925   LABSPEC 1.021 12/17/2015 1925   PHURINE 5.5 12/20/2015 1925   GLUCOSEU NEGATIVE 12/15/2015 1925   HGBUR NEGATIVE 12/07/2015 1925   BILIRUBINUR SMALL (A) 12/08/2015 1925   KETONESUR NEGATIVE 12/17/2015 1925   PROTEINUR 30 (A) 12/07/2015 1925   UROBILINOGEN 1.0 02/05/2015 1455   NITRITE NEGATIVE 12/19/2015 1925   LEUKOCYTESUR SMALL (A) 12/24/2015 1925   Sepsis Labs Invalid input(s):  PROCALCITONIN,  WBC,  LACTICIDVEN Microbiology Recent Results (from the past 240 hour(s))  Blood culture (routine x 2)     Status: None (Preliminary result)   Collection Time: 12/16/2015  7:20 PM  Result Value Ref Range Status   Specimen Description BLOOD RIGHT ANTECUBITAL  Final   Special Requests BOTTLES DRAWN AEROBIC AND ANAEROBIC 5CC  Final   Culture NO GROWTH < 24 HOURS  Final   Report Status PENDING  Incomplete  Urine culture     Status: None (Preliminary result)   Collection Time: 12/04/2015  7:30 PM  Result Value Ref Range Status   Specimen Description URINE, RANDOM  Final   Special Requests NONE  Final   Culture CULTURE REINCUBATED FOR BETTER GROWTH  Final   Report Status PENDING  Incomplete  Blood culture (routine x 2)     Status: None (Preliminary result)   Collection Time: 12/06/2015  9:42 PM  Result Value Ref Range Status   Specimen Description BLOOD RIGHT ARM  Final   Special Requests IN PEDIATRIC BOTTLE 3CC  Final   Culture NO GROWTH < 24 HOURS  Final   Report Status PENDING  Incomplete     Signed:  Geraldo Docker [facility] [id number] 01/08/2016, 6:52 PM

## 2015-12-28 NOTE — ED Notes (Signed)
Pt SpO2 100% on 6L [place].

## 2015-12-28 DEATH — deceased

## 2018-09-03 IMAGING — CT CT HEAD W/O CM
3 of 4 series · 13 of 47 positions shown, 15 images · non-contrast
Comparison: 09/24/2015

CLINICAL DATA: Altered mental status, found unresponsive at 7900
hours, history hypertension, stroke, prostate cancer

EXAM:
CT HEAD WITHOUT CONTRAST
TECHNIQUE: Contiguous axial images were obtained from the base of the skull
through the vertex without intravenous contrast.

[Series 2: head without · axial · non-contrast · 0.42mm/px · z∈[-87,+23]mm · 7 of 30 slices shown, 9 images]
[im 4/30  brain]
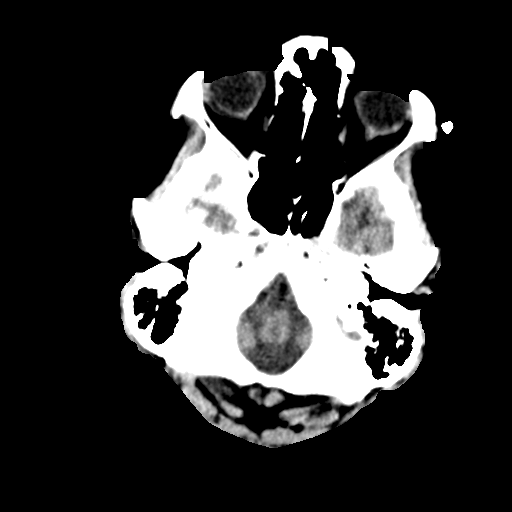
[im 4/30  bone]
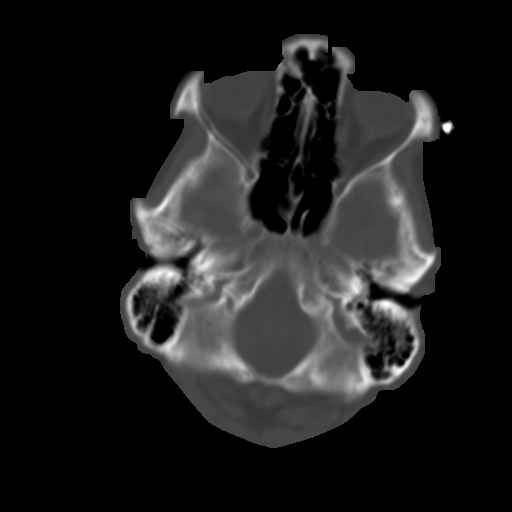
[im 8/30  brain]
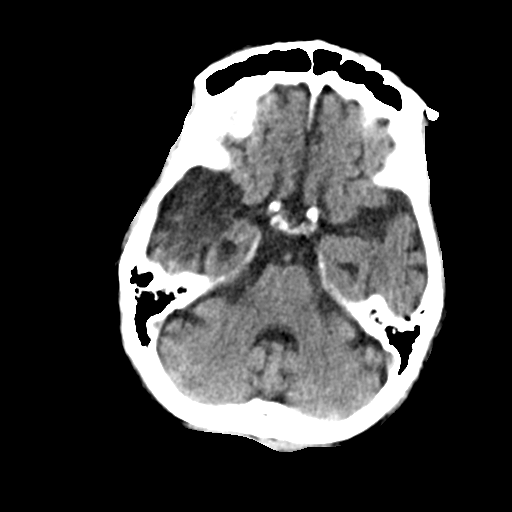
[im 11/30  brain]
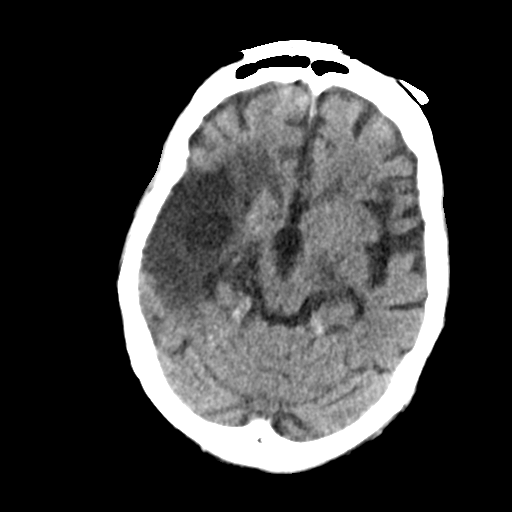
[im 15/30  brain]
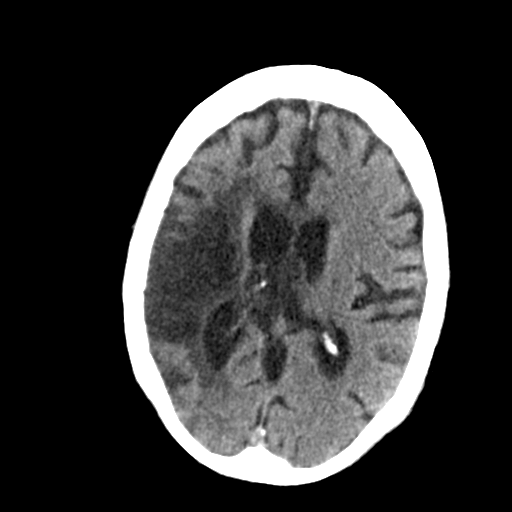
[im 19/30  brain]
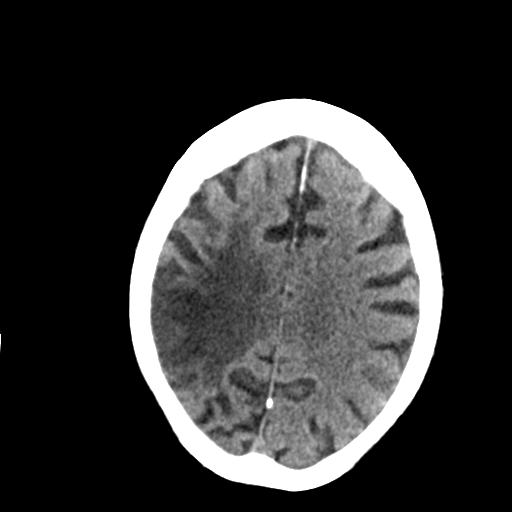
[im 19/30  bone]
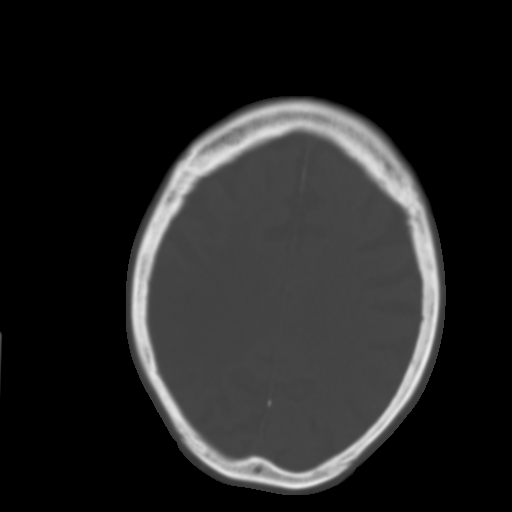
[im 22/30  brain]
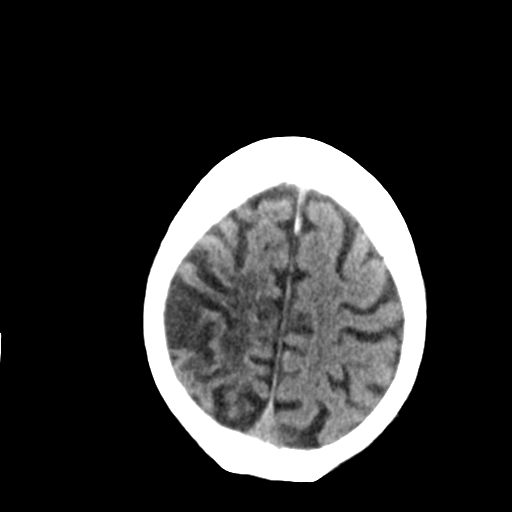
[im 26/30  brain]
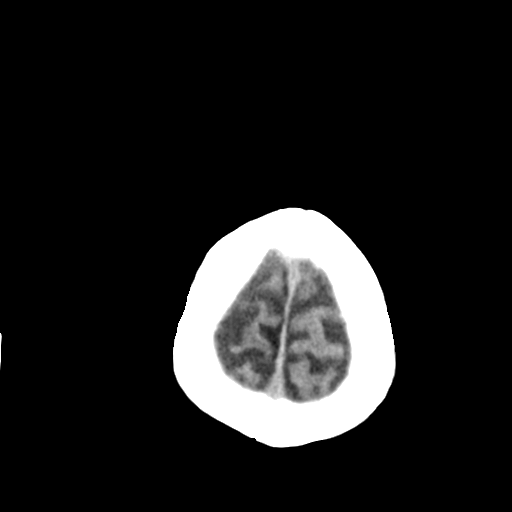

[Series 4: head without cor · coronal · non-contrast · 0.31mm/px · 3 of 64 slices shown]
[im 22/64  brain]
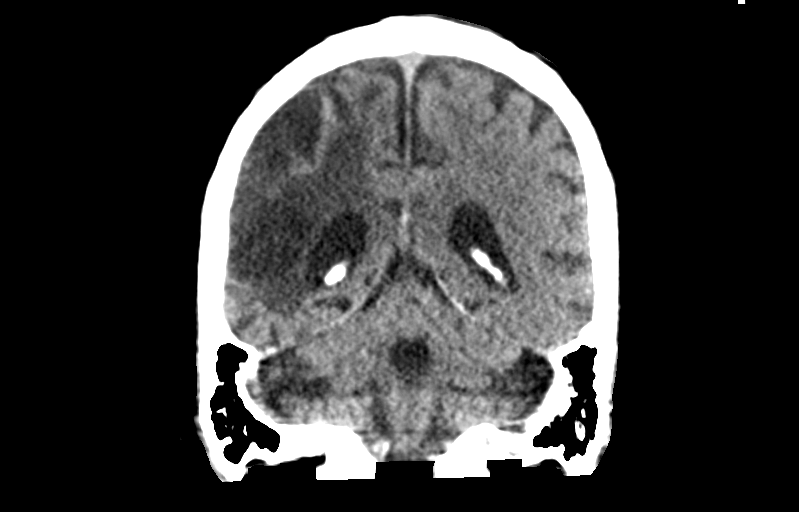
[im 29/64  brain]
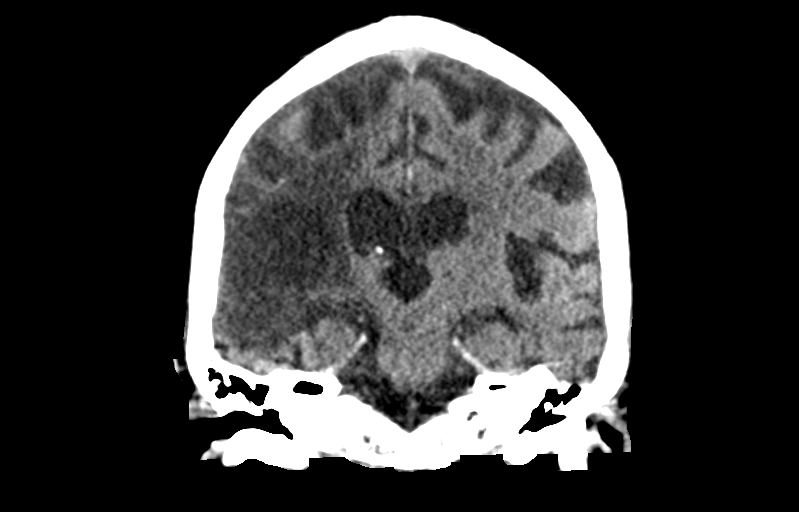
[im 36/64  brain]
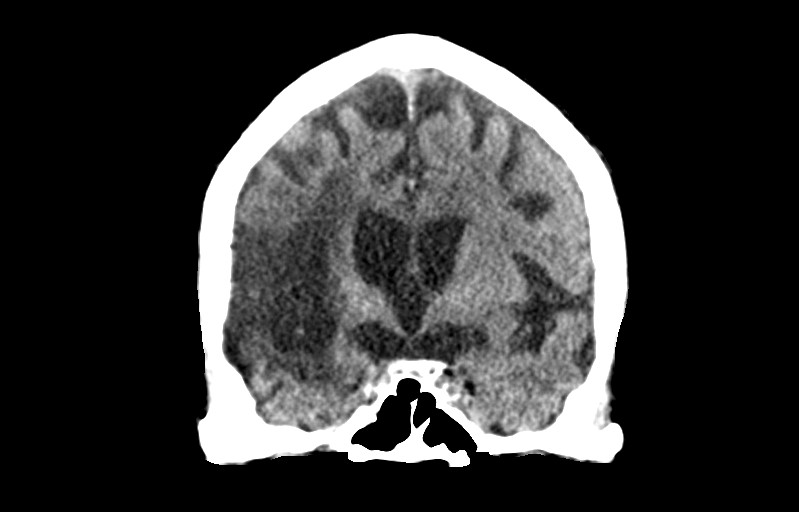

[Series 5: head without sag · sagittal · non-contrast · 0.29mm/px · 3 of 47 slices shown]
[im 16/47  brain]
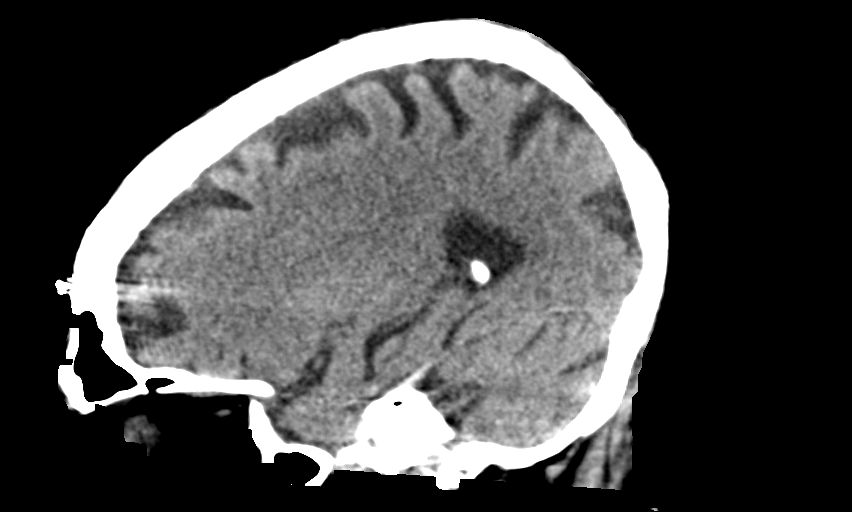
[im 24/47  brain]
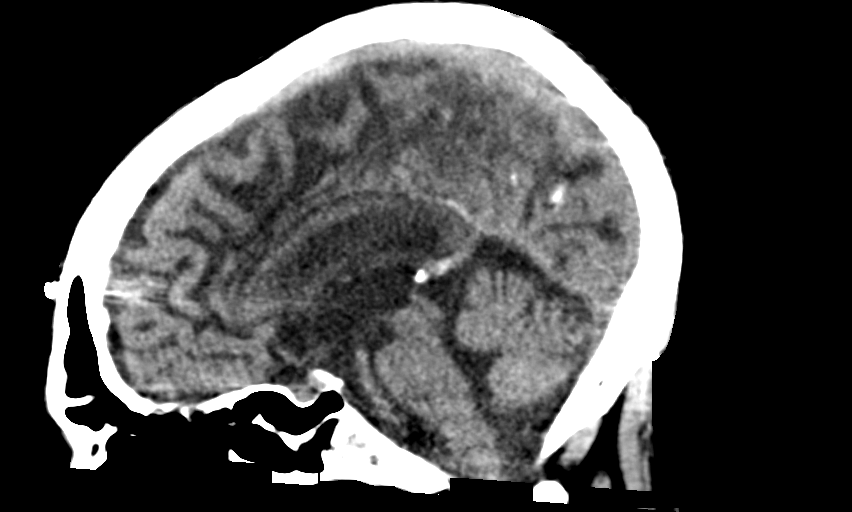
[im 31/47  brain]
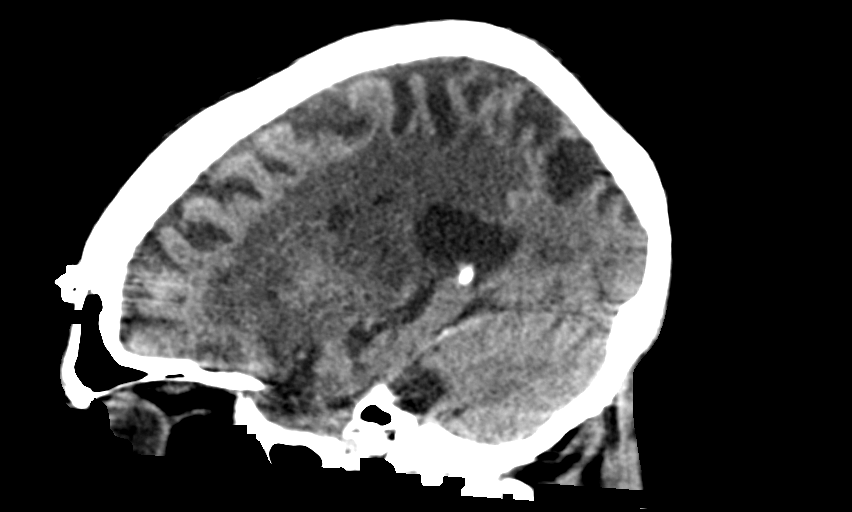

[13 of 47 positions shown; findings below may reference images not displayed]

FINDINGS: Generalized atrophy.

Stable ventricular morphology.

No midline shift or mass effect.

Encephalomalacia secondary to a large old RIGHT MCA territory
infarct.

Small vessel chronic ischemic changes of deep cerebral white matter.

No intracranial hemorrhage, mass lesion, or evidence acute
infarction.

No extra-axial fluid collections.

Atherosclerotic calcifications at the carotid siphons.

Scattered motion artifacts.

No definite osseous or sinus abnormalities.
IMPRESSION: Atrophy with small vessel chronic ischemic changes of deep cerebral
white matter.

Old RIGHT MCA territory infarct.

No acute intracranial abnormalities.
